# Patient Record
Sex: Male | Born: 1945 | Race: White | Hispanic: No | Marital: Married | Smoking: Never smoker
Health system: Southern US, Community
[De-identification: ages and names within clinical notes are randomized; demographics above are authoritative.]

## PROBLEM LIST (undated history)

## (undated) DIAGNOSIS — I10 Essential (primary) hypertension: Secondary | ICD-10-CM

## (undated) DIAGNOSIS — A809 Acute poliomyelitis, unspecified: Secondary | ICD-10-CM

## (undated) DIAGNOSIS — F329 Major depressive disorder, single episode, unspecified: Secondary | ICD-10-CM

## (undated) DIAGNOSIS — F32A Depression, unspecified: Secondary | ICD-10-CM

## (undated) DIAGNOSIS — F419 Anxiety disorder, unspecified: Secondary | ICD-10-CM

## (undated) HISTORY — PX: ANAL FISSURE REPAIR: SHX2312

---

## 2002-07-13 ENCOUNTER — Emergency Department (HOSPITAL_COMMUNITY): Admission: EM | Admit: 2002-07-13 | Discharge: 2002-07-13 | Payer: Self-pay | Admitting: Emergency Medicine

## 2002-07-15 ENCOUNTER — Emergency Department (HOSPITAL_COMMUNITY): Admission: EM | Admit: 2002-07-15 | Discharge: 2002-07-15 | Payer: Self-pay | Admitting: Emergency Medicine

## 2002-07-15 ENCOUNTER — Encounter: Payer: Self-pay | Admitting: Emergency Medicine

## 2013-06-09 ENCOUNTER — Emergency Department: Payer: Self-pay | Admitting: Emergency Medicine

## 2013-06-09 LAB — ETHANOL
Ethanol %: <0.003 % (ref 0.000–0.080)
Ethanol: <3 mg/dL

## 2013-06-09 LAB — URINALYSIS, COMPLETE
Bacteria: NONE SEEN
Bilirubin,UR: NEGATIVE
Glucose,UR: NEGATIVE mg/dL (ref 0–75)
LEUKOCYTE ESTERASE: NEGATIVE
Nitrite: NEGATIVE
PH: 5 (ref 4.5–8.0)
SPECIFIC GRAVITY: 1.024 (ref 1.003–1.030)
Squamous Epithelial: NONE SEEN
WBC UR: 5 /HPF (ref 0–5)

## 2013-06-09 LAB — COMPREHENSIVE METABOLIC PANEL
ALK PHOS: 77 U/L
ANION GAP: 5 — AB (ref 7–16)
Albumin: 4 g/dL (ref 3.4–5.0)
BUN: 12 mg/dL (ref 7–18)
Bilirubin,Total: 0.8 mg/dL (ref 0.2–1.0)
CHLORIDE: 106 mmol/L (ref 98–107)
Calcium, Total: 9.1 mg/dL (ref 8.5–10.1)
Co2: 29 mmol/L (ref 21–32)
Creatinine: 0.87 mg/dL (ref 0.60–1.30)
EGFR (African American): >60
Glucose: 118 mg/dL — ABNORMAL HIGH (ref 65–99)
OSMOLALITY: 280 (ref 275–301)
Potassium: 3.9 mmol/L (ref 3.5–5.1)
SGOT(AST): 39 U/L — ABNORMAL HIGH (ref 15–37)
SGPT (ALT): 72 U/L (ref 12–78)
SODIUM: 140 mmol/L (ref 136–145)
Total Protein: 7.2 g/dL (ref 6.4–8.2)

## 2013-06-09 LAB — DRUG SCREEN, URINE
Amphetamines, Ur Screen: NEGATIVE (ref ?–1000)
Barbiturates, Ur Screen: NEGATIVE (ref ?–200)
Benzodiazepine, Ur Scrn: POSITIVE (ref ?–200)
Cannabinoid 50 Ng, Ur ~~LOC~~: NEGATIVE (ref ?–50)
Cocaine Metabolite,Ur ~~LOC~~: NEGATIVE (ref ?–300)
MDMA (ECSTASY) UR SCREEN: NEGATIVE (ref ?–500)
Methadone, Ur Screen: NEGATIVE (ref ?–300)
OPIATE, UR SCREEN: NEGATIVE (ref ?–300)
PHENCYCLIDINE (PCP) UR S: NEGATIVE (ref ?–25)
Tricyclic, Ur Screen: NEGATIVE (ref ?–1000)

## 2013-06-09 LAB — CBC
HCT: 52.3 % — ABNORMAL HIGH (ref 40.0–52.0)
HGB: 17.5 g/dL (ref 13.0–18.0)
MCH: 31.5 pg (ref 26.0–34.0)
MCHC: 33.4 g/dL (ref 32.0–36.0)
MCV: 94 fL (ref 80–100)
Platelet: 270 10*3/uL (ref 150–440)
RBC: 5.55 10*6/uL (ref 4.40–5.90)
RDW: 13.8 % (ref 11.5–14.5)
WBC: 16.8 10*3/uL — AB (ref 3.8–10.6)

## 2013-06-09 LAB — ACETAMINOPHEN LEVEL: Acetaminophen: <2 ug/mL

## 2013-06-09 LAB — SALICYLATE LEVEL: Salicylates, Serum: <1.7 mg/dL

## 2013-08-04 ENCOUNTER — Emergency Department: Payer: Self-pay | Admitting: Emergency Medicine

## 2013-08-04 LAB — COMPREHENSIVE METABOLIC PANEL
ALK PHOS: 95 U/L
Albumin: 4 g/dL (ref 3.4–5.0)
Anion Gap: 5 — ABNORMAL LOW (ref 7–16)
BUN: 14 mg/dL (ref 7–18)
Bilirubin,Total: 0.5 mg/dL (ref 0.2–1.0)
CALCIUM: 9.3 mg/dL (ref 8.5–10.1)
CHLORIDE: 107 mmol/L (ref 98–107)
Co2: 30 mmol/L (ref 21–32)
Creatinine: 0.79 mg/dL (ref 0.60–1.30)
EGFR (African American): >60
Glucose: 104 mg/dL — ABNORMAL HIGH (ref 65–99)
Osmolality: 284 (ref 275–301)
Potassium: 3.9 mmol/L (ref 3.5–5.1)
SGOT(AST): 27 U/L (ref 15–37)
SGPT (ALT): 43 U/L (ref 12–78)
SODIUM: 142 mmol/L (ref 136–145)
Total Protein: 7.6 g/dL (ref 6.4–8.2)

## 2013-08-04 LAB — CBC
HCT: 46.7 % (ref 40.0–52.0)
HGB: 15.3 g/dL (ref 13.0–18.0)
MCH: 31.2 pg (ref 26.0–34.0)
MCHC: 32.7 g/dL (ref 32.0–36.0)
MCV: 95 fL (ref 80–100)
PLATELETS: 269 10*3/uL (ref 150–440)
RBC: 4.9 10*6/uL (ref 4.40–5.90)
RDW: 13.9 % (ref 11.5–14.5)
WBC: 5.5 10*3/uL (ref 3.8–10.6)

## 2013-08-04 LAB — URINALYSIS, COMPLETE
BILIRUBIN, UR: NEGATIVE
BLOOD: NEGATIVE
Bacteria: NONE SEEN
Glucose,UR: NEGATIVE mg/dL (ref 0–75)
Leukocyte Esterase: NEGATIVE
Nitrite: NEGATIVE
Ph: 6 (ref 4.5–8.0)
Protein: NEGATIVE
RBC,UR: 1 /HPF (ref 0–5)
SQUAMOUS EPITHELIAL: NONE SEEN
Specific Gravity: 1.024 (ref 1.003–1.030)

## 2013-08-04 LAB — DRUG SCREEN, URINE
Amphetamines, Ur Screen: NEGATIVE (ref ?–1000)
BENZODIAZEPINE, UR SCRN: POSITIVE (ref ?–200)
Barbiturates, Ur Screen: NEGATIVE (ref ?–200)
CANNABINOID 50 NG, UR ~~LOC~~: NEGATIVE (ref ?–50)
COCAINE METABOLITE, UR ~~LOC~~: NEGATIVE (ref ?–300)
MDMA (ECSTASY) UR SCREEN: NEGATIVE (ref ?–500)
Methadone, Ur Screen: NEGATIVE (ref ?–300)
Opiate, Ur Screen: NEGATIVE (ref ?–300)
Phencyclidine (PCP) Ur S: NEGATIVE (ref ?–25)
TRICYCLIC, UR SCREEN: NEGATIVE (ref ?–1000)

## 2013-08-04 LAB — ETHANOL
Ethanol %: <0.003 % (ref 0.000–0.080)
Ethanol: <3 mg/dL

## 2013-08-04 LAB — SALICYLATE LEVEL: Salicylates, Serum: <1.7 mg/dL

## 2013-08-04 LAB — ACETAMINOPHEN LEVEL

## 2013-08-04 LAB — TSH: THYROID STIMULATING HORM: 0.522 u[IU]/mL

## 2013-08-21 ENCOUNTER — Emergency Department (HOSPITAL_COMMUNITY)
Admission: EM | Admit: 2013-08-21 | Discharge: 2013-08-22 | Disposition: A | Discharge status: Home / Self Care | Payer: Medicare HMO | Attending: Emergency Medicine | Admitting: Emergency Medicine

## 2013-08-21 ENCOUNTER — Encounter (HOSPITAL_COMMUNITY): Payer: Self-pay | Admitting: Emergency Medicine

## 2013-08-21 DIAGNOSIS — F3289 Other specified depressive episodes: Secondary | ICD-10-CM | POA: Insufficient documentation

## 2013-08-21 DIAGNOSIS — R45851 Suicidal ideations: Secondary | ICD-10-CM | POA: Insufficient documentation

## 2013-08-21 DIAGNOSIS — I10 Essential (primary) hypertension: Secondary | ICD-10-CM | POA: Insufficient documentation

## 2013-08-21 DIAGNOSIS — F339 Major depressive disorder, recurrent, unspecified: Secondary | ICD-10-CM | POA: Diagnosis present

## 2013-08-21 DIAGNOSIS — Z8612 Personal history of poliomyelitis: Secondary | ICD-10-CM | POA: Insufficient documentation

## 2013-08-21 DIAGNOSIS — F329 Major depressive disorder, single episode, unspecified: Secondary | ICD-10-CM | POA: Insufficient documentation

## 2013-08-21 DIAGNOSIS — H5316 Psychophysical visual disturbances: Secondary | ICD-10-CM | POA: Insufficient documentation

## 2013-08-21 HISTORY — DX: Depression, unspecified: F32.A

## 2013-08-21 HISTORY — DX: Anxiety disorder, unspecified: F41.9

## 2013-08-21 HISTORY — DX: Acute poliomyelitis, unspecified: A80.9

## 2013-08-21 HISTORY — DX: Essential (primary) hypertension: I10

## 2013-08-21 HISTORY — DX: Major depressive disorder, single episode, unspecified: F32.9

## 2013-08-21 LAB — COMPREHENSIVE METABOLIC PANEL
ALT: 32 U/L (ref 0–53)
AST: 24 U/L (ref 0–37)
Albumin: 3.9 g/dL (ref 3.5–5.2)
Alkaline Phosphatase: 82 U/L (ref 39–117)
BILIRUBIN TOTAL: 0.4 mg/dL (ref 0.3–1.2)
BUN: 18 mg/dL (ref 6–23)
CHLORIDE: 104 meq/L (ref 96–112)
CO2: 26 mEq/L (ref 19–32)
Calcium: 9.4 mg/dL (ref 8.4–10.5)
Creatinine, Ser: 0.81 mg/dL (ref 0.50–1.35)
GFR calc Af Amer: >90 mL/min (ref >90)
GFR calc non Af Amer: 89 mL/min — ABNORMAL LOW (ref >90)
Glucose, Bld: 101 mg/dL — ABNORMAL HIGH (ref 70–99)
Potassium: 4.3 mEq/L (ref 3.7–5.3)
Sodium: 144 mEq/L (ref 137–147)
Total Protein: 6.8 g/dL (ref 6.0–8.3)

## 2013-08-21 LAB — RAPID URINE DRUG SCREEN, HOSP PERFORMED
Amphetamines: NOT DETECTED
BENZODIAZEPINES: POSITIVE — AB
Barbiturates: NOT DETECTED
Cocaine: NOT DETECTED
OPIATES: NOT DETECTED
Tetrahydrocannabinol: NOT DETECTED

## 2013-08-21 LAB — CBC
HEMATOCRIT: 47 % (ref 39.0–52.0)
Hemoglobin: 15.6 g/dL (ref 13.0–17.0)
MCH: 31.1 pg (ref 26.0–34.0)
MCHC: 33.2 g/dL (ref 30.0–36.0)
MCV: 93.6 fL (ref 78.0–100.0)
Platelets: 285 10*3/uL (ref 150–400)
RBC: 5.02 MIL/uL (ref 4.22–5.81)
RDW: 13.9 % (ref 11.5–15.5)
WBC: 7.2 10*3/uL (ref 4.0–10.5)

## 2013-08-21 LAB — ACETAMINOPHEN LEVEL: Acetaminophen (Tylenol), Serum: <15 ug/mL (ref 10–30)

## 2013-08-21 LAB — SALICYLATE LEVEL

## 2013-08-21 LAB — ETHANOL

## 2013-08-21 MED ORDER — ZOLPIDEM TARTRATE 10 MG PO TABS
10.0000 mg | ORAL_TABLET | Freq: Every day | ORAL | Status: DC
  Administered 2013-08-22: 10 mg via ORAL
  Filled 2013-08-21: qty 1

## 2013-08-21 MED ORDER — ACETAMINOPHEN 325 MG PO TABS: 650.0000 mg | ORAL_TABLET | ORAL | Status: DC | PRN

## 2013-08-21 MED ORDER — ALPRAZOLAM 0.5 MG PO TABS
0.5000 mg | ORAL_TABLET | Freq: Two times a day (BID) | ORAL | Status: DC
  Administered 2013-08-21 – 2013-08-22 (×2): 0.5 mg via ORAL
  Filled 2013-08-21 (×2): qty 1

## 2013-08-21 MED ORDER — VENLAFAXINE HCL 75 MG PO TABS
75.0000 mg | ORAL_TABLET | Freq: Every evening | ORAL | Status: DC
  Administered 2013-08-22: 75 mg via ORAL
  Filled 2013-08-21: qty 1

## 2013-08-21 MED ORDER — LISINOPRIL 20 MG PO TABS
20.0000 mg | ORAL_TABLET | Freq: Every morning | ORAL | Status: DC
  Administered 2013-08-22: 20 mg via ORAL
  Filled 2013-08-21: qty 1

## 2013-08-21 MED ORDER — OLANZAPINE 5 MG PO TABS
5.0000 mg | ORAL_TABLET | Freq: Two times a day (BID) | ORAL | Status: DC
  Administered 2013-08-21 – 2013-08-22 (×2): 5 mg via ORAL
  Filled 2013-08-21 (×2): qty 1

## 2013-08-21 MED ORDER — ZOLPIDEM TARTRATE 5 MG PO TABS: 5.0000 mg | ORAL_TABLET | Freq: Every evening | ORAL | Status: DC | PRN

## 2013-08-21 MED ORDER — IBUPROFEN 200 MG PO TABS: 600.0000 mg | ORAL_TABLET | Freq: Three times a day (TID) | ORAL | Status: DC | PRN

## 2013-08-21 MED ORDER — LORAZEPAM 1 MG PO TABS: 1.0000 mg | ORAL_TABLET | Freq: Three times a day (TID) | ORAL | Status: DC | PRN

## 2013-08-21 MED ORDER — MEMANTINE HCL 5 MG PO TABS
5.0000 mg | ORAL_TABLET | Freq: Every day | ORAL | Status: DC
  Administered 2013-08-22: 5 mg via ORAL
  Filled 2013-08-21: qty 1

## 2013-08-21 MED ORDER — ONDANSETRON HCL 4 MG PO TABS: 4.0000 mg | ORAL_TABLET | Freq: Three times a day (TID) | ORAL | Status: DC | PRN

## 2013-08-21 MED ORDER — ALUM & MAG HYDROXIDE-SIMETH 200-200-20 MG/5ML PO SUSP: 30.0000 mL | ORAL | Status: DC | PRN

## 2013-08-21 MED ORDER — NICOTINE 21 MG/24HR TD PT24: 21.0000 mg | MEDICATED_PATCH | Freq: Every day | TRANSDERMAL | Status: DC

## 2013-08-21 MED ORDER — VENLAFAXINE HCL ER 150 MG PO CP24
150.0000 mg | ORAL_CAPSULE | Freq: Every day | ORAL | Status: DC
  Administered 2013-08-21 – 2013-08-22 (×2): 150 mg via ORAL
  Filled 2013-08-21 (×2): qty 1

## 2013-08-21 MED ORDER — GABAPENTIN 100 MG PO CAPS
200.0000 mg | ORAL_CAPSULE | Freq: Two times a day (BID) | ORAL | Status: DC
  Administered 2013-08-21 – 2013-08-22 (×2): 200 mg via ORAL
  Filled 2013-08-21 (×3): qty 2

## 2013-08-21 NOTE — ED Notes (Signed)
TTS consult completed 
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
## 2013-08-21 NOTE — ED Provider Notes (Signed)
Alex MutterPeter S Jaylenne Allison 8:00 PM patient discussed in sign out. Patient presenting with suicidal ideations. Laboratory tests are pending for further medical clearance. Initial examination without any concerns.   9:00 p.m. laboratory testing within normal limits. Patient is medically cleared and stable for further psychiatric evaluation.  Angus Sellereter S Frazier Balfour, PA-C 08/21/13 2135

## 2013-08-21 NOTE — ED Notes (Signed)
Unit secretary [facility]--several patient ahead of patient, will be awhile before TTS completed Patient and pt's family made aware

## 2013-08-21 NOTE — ED Notes (Signed)
Pt escorted back to closed TCU to change into blue scrubs. Pt must keep his leg braces one, and tennis shoes as he is unable to walk without these. All other belonging placed into pt belonging bags, and put behind nurses station by [employer].
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
## 2013-08-21 NOTE — ED Notes (Signed)
[facility] on arrival of staff for TTS consult, per family request--no answer  Candise BowensJen, RN ED Charge Nurse aware and family made aware that attempts are still being made to find out an ETA for TTS

## 2013-08-21 NOTE — ED Notes (Signed)
PM medications given, see MAR Patient moved to TCU for TTS consult to take place

## 2013-08-21 NOTE — ED Notes (Signed)
Pt aware urine sample is needed 

## 2013-08-21 NOTE — ED Provider Notes (Signed)
Medical screening examination/treatment/procedure(s) were performed by non-physician practitioner and as supervising physician I was immediately available for consultation/collaboration.    Linwood DibblesJon Solstice Lastinger, MD 08/21/13 2017

## 2013-08-22 ENCOUNTER — Encounter (HOSPITAL_COMMUNITY): Payer: Self-pay | Admitting: Psychiatry

## 2013-08-22 ENCOUNTER — Emergency Department (HOSPITAL_COMMUNITY): Payer: Medicare HMO

## 2013-08-22 DIAGNOSIS — F332 Major depressive disorder, recurrent severe without psychotic features: Secondary | ICD-10-CM

## 2013-08-22 DIAGNOSIS — F339 Major depressive disorder, recurrent, unspecified: Secondary | ICD-10-CM | POA: Diagnosis present

## 2013-08-22 DIAGNOSIS — R45851 Suicidal ideations: Secondary | ICD-10-CM

## 2013-08-22 MED ORDER — MEMANTINE HCL 5 MG PO TABS: 5.0000 mg | ORAL_TABLET | Freq: Every day | ORAL | Status: DC

## 2013-08-22 MED ORDER — ZOLPIDEM TARTRATE 5 MG PO TABS: 5.0000 mg | ORAL_TABLET | Freq: Every day | ORAL | Status: DC

## 2013-08-22 MED ORDER — MEMANTINE HCL 5 MG PO TABS: 5.0000 mg | ORAL_TABLET | Freq: Two times a day (BID) | ORAL | Status: DC

## 2013-08-22 MED ORDER — MEMANTINE HCL 10 MG PO TABS: 10.0000 mg | ORAL_TABLET | Freq: Two times a day (BID) | ORAL | Status: DC

## 2013-08-22 MED ORDER — MEMANTINE HCL 10 MG PO TABS: 10.0000 mg | ORAL_TABLET | Freq: Every day | ORAL | Status: DC

## 2013-08-22 NOTE — Progress Notes (Signed)
CSW received a call from [place] with [facility] accepting the patient per Dr. Seth Bake. Report#[phone]. [facility] address is [address] [place] [place], [place] [address]. CSW informed the nursing staff of the updated disposition and the patient can arrive for admission as soon as possible.       Maryelizabeth Rowan, MSW, [place], 08/22/2013 Evening Clinical Social Worker [phone]

## 2013-08-22 NOTE — ED Notes (Signed)
Bed: WA28 Expected date:  Expected time:  Means of arrival:  Comments: TCU 

## 2013-08-22 NOTE — Discharge Instructions (Signed)
Go directly to [facility]

## 2013-08-22 NOTE — ED Notes (Signed)
Patient resting in position of comfort with eyes closed s/p admin of Ambien, see MAR RR WNL--even and unlabored with equal rise and fall of chest Patient in NAD Side rails up, call bell in reach

## 2013-08-22 NOTE — ED Notes (Signed)
Call from [facility] inquiring about patients mobility and need for assistance with ADL's.

## 2013-08-22 NOTE — ED Notes (Signed)
MD at bedside, Dr. Kumar.  

## 2013-08-22 NOTE — BH Assessment (Signed)
Pt is currently voluntary and spouse has POA, not HCPOA.  Pt.'s spouse states that he doesn't want to sign in voluntarily for inpt admission and may need to be committed.  This Clinical research associate spoke with pt and advised that psychiatric pa, Alberteen Sam, NP has recommended inpt admissions.  This Clinical research associate contacted the following facilities: [facility](expecting d/c's 05/22), [facility](expecting d/c's 05/22), [facility](bed avail 05/22) and [facility](bed avail).  All facilities state they may be able to accommodate pt ambulatory needs after discussing case with the on-call physician.  This Clinical research associate will fax info to facilities for review.

## 2013-08-22 NOTE — ED Provider Notes (Signed)
Patient excepted at [facility]. Has been cleared to go by private vehicle to the ED by the accepting physician.  Toy Baker, MD 08/22/13 2045

## 2013-08-22 NOTE — ED Notes (Signed)
Patient transported to X-ray 

## 2013-08-22 NOTE — ED Notes (Signed)
Patient's wife, Fraser Vlahos [phone]

## 2013-08-22 NOTE — ED Notes (Signed)
Patients wife was given all patients belongings, No belongings left in  [place].

## 2013-08-22 NOTE — Progress Notes (Signed)
  CARE MANAGEMENT ED NOTE 08/22/2013  Patient:  Alex Allison   Account Number:  [id number]  Date Initiated:  08/22/2013  Documentation initiated by:  Radford Pax  Subjective/Objective Assessment:   Patient presents to Ed with SI     Subjective/Objective Assessment Detail:   Patient with pmhx of depression and anxiety.     Action/Plan:   Action/Plan Detail:   Anticipated DC Date:       Status Recommendation to Physician:   Result of Recommendation:    Other ED Services  Consult Working Plan    DC Planning Services  Other  PCP issues    Choice offered to / List presented to:            Status of service:  Completed, signed off  ED Comments:   ED Comments Detail:  EDCM spoke to patient and family member at bedside.  As per patient's family member, patient's pcp is Dr. Juel Burrow at [facility].  System updated.

## 2013-08-22 NOTE — ED Notes (Signed)
1:1 sitter present at bedside Patient awake, watching TV and denies complaints or needs at this time Call bell in reach

## 2013-08-22 NOTE — ED Notes (Signed)
Pt accepted at [facility].  Nursing contacted [facility] and was told that pt was ok to be transported by Wife to the facility.  Request for Voluntary Admission faxed to [facility] and pt discharged.  Nursing did consult with EDP Dr. Freida Busman who stated that transport method was acceptable.
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
## 2013-08-22 NOTE — Consult Note (Signed)
Patient seen, evaluated, treatment plan formulated by me. Patient's wife reports that patient is severely depressed, has been seeing his outpatient psychiatrist 100 your basis since his discharge from [facility]. My states that the patient is overwhelmed, is having thoughts of killing himself. She reports that the patient was to be involuntarily committed by his psychiatrist due to safety concerns. Patient severely depressed, suicidal and needs inpatient treatment
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
## 2014-08-16 ENCOUNTER — Encounter: Payer: Self-pay | Admitting: Emergency Medicine

## 2014-08-16 ENCOUNTER — Emergency Department: Payer: Medicare HMO

## 2014-08-16 ENCOUNTER — Inpatient Hospital Stay
Admission: EM | Admit: 2014-08-16 | Discharge: 2014-09-02 | DRG: 408 | Disposition: E | Payer: Medicare HMO | Attending: Internal Medicine | Admitting: Internal Medicine

## 2014-08-16 DIAGNOSIS — R0902 Hypoxemia: Secondary | ICD-10-CM | POA: Diagnosis not present

## 2014-08-16 DIAGNOSIS — Z66 Do not resuscitate: Secondary | ICD-10-CM | POA: Diagnosis present

## 2014-08-16 DIAGNOSIS — J95821 Acute postprocedural respiratory failure: Secondary | ICD-10-CM | POA: Diagnosis not present

## 2014-08-16 DIAGNOSIS — J690 Pneumonitis due to inhalation of food and vomit: Secondary | ICD-10-CM | POA: Diagnosis not present

## 2014-08-16 DIAGNOSIS — K810 Acute cholecystitis: Secondary | ICD-10-CM

## 2014-08-16 DIAGNOSIS — A419 Sepsis, unspecified organism: Secondary | ICD-10-CM | POA: Diagnosis not present

## 2014-08-16 DIAGNOSIS — G894 Chronic pain syndrome: Secondary | ICD-10-CM | POA: Diagnosis present

## 2014-08-16 DIAGNOSIS — K819 Cholecystitis, unspecified: Secondary | ICD-10-CM

## 2014-08-16 DIAGNOSIS — R0602 Shortness of breath: Secondary | ICD-10-CM

## 2014-08-16 DIAGNOSIS — M4856XA Collapsed vertebra, not elsewhere classified, lumbar region, initial encounter for fracture: Secondary | ICD-10-CM | POA: Diagnosis present

## 2014-08-16 DIAGNOSIS — R092 Respiratory arrest: Secondary | ICD-10-CM

## 2014-08-16 DIAGNOSIS — F329 Major depressive disorder, single episode, unspecified: Secondary | ICD-10-CM | POA: Diagnosis present

## 2014-08-16 DIAGNOSIS — G14 Postpolio syndrome: Secondary | ICD-10-CM | POA: Diagnosis present

## 2014-08-16 DIAGNOSIS — G9340 Encephalopathy, unspecified: Secondary | ICD-10-CM | POA: Diagnosis not present

## 2014-08-16 DIAGNOSIS — G8929 Other chronic pain: Secondary | ICD-10-CM | POA: Diagnosis not present

## 2014-08-16 DIAGNOSIS — M4854XA Collapsed vertebra, not elsewhere classified, thoracic region, initial encounter for fracture: Secondary | ICD-10-CM | POA: Diagnosis present

## 2014-08-16 DIAGNOSIS — J9589 Other postprocedural complications and disorders of respiratory system, not elsewhere classified: Secondary | ICD-10-CM | POA: Diagnosis not present

## 2014-08-16 DIAGNOSIS — Z515 Encounter for palliative care: Secondary | ICD-10-CM

## 2014-08-16 DIAGNOSIS — J383 Other diseases of vocal cords: Secondary | ICD-10-CM | POA: Diagnosis not present

## 2014-08-16 DIAGNOSIS — R7989 Other specified abnormal findings of blood chemistry: Secondary | ICD-10-CM | POA: Diagnosis present

## 2014-08-16 DIAGNOSIS — R6520 Severe sepsis without septic shock: Secondary | ICD-10-CM | POA: Diagnosis not present

## 2014-08-16 DIAGNOSIS — Z978 Presence of other specified devices: Secondary | ICD-10-CM

## 2014-08-16 DIAGNOSIS — N200 Calculus of kidney: Secondary | ICD-10-CM | POA: Diagnosis present

## 2014-08-16 DIAGNOSIS — R531 Weakness: Secondary | ICD-10-CM | POA: Diagnosis not present

## 2014-08-16 DIAGNOSIS — I10 Essential (primary) hypertension: Secondary | ICD-10-CM | POA: Diagnosis present

## 2014-08-16 DIAGNOSIS — R0689 Other abnormalities of breathing: Secondary | ICD-10-CM | POA: Diagnosis not present

## 2014-08-16 DIAGNOSIS — K8066 Calculus of gallbladder and bile duct with acute and chronic cholecystitis without obstruction: Secondary | ICD-10-CM | POA: Diagnosis present

## 2014-08-16 DIAGNOSIS — K8000 Calculus of gallbladder with acute cholecystitis without obstruction: Secondary | ICD-10-CM | POA: Diagnosis present

## 2014-08-16 DIAGNOSIS — M6289 Other specified disorders of muscle: Secondary | ICD-10-CM | POA: Diagnosis present

## 2014-08-16 DIAGNOSIS — J9600 Acute respiratory failure, unspecified whether with hypoxia or hypercapnia: Secondary | ICD-10-CM | POA: Diagnosis not present

## 2014-08-16 DIAGNOSIS — J9811 Atelectasis: Secondary | ICD-10-CM | POA: Diagnosis not present

## 2014-08-16 DIAGNOSIS — K8050 Calculus of bile duct without cholangitis or cholecystitis without obstruction: Secondary | ICD-10-CM

## 2014-08-16 DIAGNOSIS — Z4659 Encounter for fitting and adjustment of other gastrointestinal appliance and device: Secondary | ICD-10-CM

## 2014-08-16 DIAGNOSIS — J9690 Respiratory failure, unspecified, unspecified whether with hypoxia or hypercapnia: Secondary | ICD-10-CM

## 2014-08-16 LAB — CBC WITH DIFFERENTIAL/PLATELET
BASOS PCT: 1 %
Basophils Absolute: 0.1 10*3/uL (ref 0–0.1)
EOS ABS: 0 10*3/uL (ref 0–0.7)
EOS PCT: 0 %
HEMATOCRIT: 48.1 % (ref 40.0–52.0)
HEMOGLOBIN: 15.8 g/dL (ref 13.0–18.0)
Lymphocytes Relative: 8 %
Lymphs Abs: 1 10*3/uL (ref 1.0–3.6)
MCH: 30.7 pg (ref 26.0–34.0)
MCHC: 32.9 g/dL (ref 32.0–36.0)
MCV: 93.3 fL (ref 80.0–100.0)
MONO ABS: 1.2 10*3/uL — AB (ref 0.2–1.0)
Monocytes Relative: 9 %
Neutro Abs: 10.6 10*3/uL — ABNORMAL HIGH (ref 1.4–6.5)
Neutrophils Relative %: 82 %
Platelets: 252 10*3/uL (ref 150–440)
RBC: 5.16 MIL/uL (ref 4.40–5.90)
RDW: 14 % (ref 11.5–14.5)
WBC: 12.9 10*3/uL — ABNORMAL HIGH (ref 3.8–10.6)

## 2014-08-16 LAB — COMPREHENSIVE METABOLIC PANEL
ALBUMIN: 4.5 g/dL (ref 3.5–5.0)
ALT: 27 U/L (ref 17–63)
AST: 28 U/L (ref 15–41)
Alkaline Phosphatase: 64 U/L (ref 38–126)
Anion gap: 9 (ref 5–15)
BUN: 13 mg/dL (ref 6–20)
CHLORIDE: 104 mmol/L (ref 101–111)
CO2: 27 mmol/L (ref 22–32)
Calcium: 9.1 mg/dL (ref 8.9–10.3)
Creatinine, Ser: 0.87 mg/dL (ref 0.61–1.24)
GLUCOSE: 135 mg/dL — AB (ref 65–99)
POTASSIUM: 4.2 mmol/L (ref 3.5–5.1)
Sodium: 140 mmol/L (ref 135–145)
TOTAL PROTEIN: 7.5 g/dL (ref 6.5–8.1)
Total Bilirubin: 0.7 mg/dL (ref 0.3–1.2)

## 2014-08-16 LAB — URINALYSIS COMPLETE WITH MICROSCOPIC (ARMC ONLY)
Bacteria, UA: NONE SEEN
Bilirubin Urine: NEGATIVE
Glucose, UA: NEGATIVE mg/dL
Ketones, ur: NEGATIVE mg/dL
Leukocytes, UA: NEGATIVE
NITRITE: NEGATIVE
Protein, ur: NEGATIVE mg/dL
Specific Gravity, Urine: 1.021 (ref 1.005–1.030)
pH: 7 (ref 5.0–8.0)

## 2014-08-16 LAB — GLUCOSE, CAPILLARY: Glucose-Capillary: 117 mg/dL — ABNORMAL HIGH (ref 65–99)

## 2014-08-16 MED ORDER — GABAPENTIN 100 MG PO CAPS
200.0000 mg | ORAL_CAPSULE | Freq: Two times a day (BID) | ORAL | Status: DC
  Administered 2014-08-17 – 2014-08-28 (×18): 200 mg via ORAL
  Filled 2014-08-16 (×19): qty 2

## 2014-08-16 MED ORDER — MIRTAZAPINE 15 MG PO TABS
22.5000 mg | ORAL_TABLET | Freq: Every day | ORAL | Status: DC
  Administered 2014-08-17 – 2014-08-27 (×10): 22.5 mg via ORAL
  Filled 2014-08-16 (×11): qty 2

## 2014-08-16 MED ORDER — PANTOPRAZOLE SODIUM 40 MG IV SOLR
40.0000 mg | Freq: Two times a day (BID) | INTRAVENOUS | Status: DC
  Administered 2014-08-16 – 2014-08-20 (×9): 40 mg via INTRAVENOUS
  Filled 2014-08-16 (×9): qty 40

## 2014-08-16 MED ORDER — TRAZODONE HCL 50 MG PO TABS
50.0000 mg | ORAL_TABLET | Freq: Every day | ORAL | Status: DC
  Administered 2014-08-17 – 2014-08-27 (×10): 50 mg via ORAL
  Filled 2014-08-16 (×11): qty 1

## 2014-08-16 MED ORDER — SODIUM CHLORIDE 0.9 % IV SOLN
3.0000 g | Freq: Four times a day (QID) | INTRAVENOUS | Status: DC
  Administered 2014-08-16 – 2014-08-22 (×23): 3 g via INTRAVENOUS
  Administered 2014-08-22: 10:00:00 via INTRAVENOUS
  Administered 2014-08-22 – 2014-08-25 (×11): 3 g via INTRAVENOUS
  Administered 2014-08-25: 04:00:00 via INTRAVENOUS
  Administered 2014-08-25 – 2014-08-28 (×10): 3 g via INTRAVENOUS
  Filled 2014-08-16 (×57): qty 3

## 2014-08-16 MED ORDER — KETOROLAC TROMETHAMINE 30 MG/ML IJ SOLN
INTRAMUSCULAR | Status: AC
  Administered 2014-08-16: 30 mg via INTRAVENOUS
  Filled 2014-08-16: qty 1

## 2014-08-16 MED ORDER — KETOROLAC TROMETHAMINE 30 MG/ML IJ SOLN
30.0000 mg | Freq: Once | INTRAMUSCULAR | Status: AC
  Administered 2014-08-16: 30 mg via INTRAVENOUS

## 2014-08-16 MED ORDER — ONDANSETRON HCL 4 MG PO TABS: 4.0000 mg | ORAL_TABLET | Freq: Four times a day (QID) | ORAL | Status: DC | PRN

## 2014-08-16 MED ORDER — OLANZAPINE 2.5 MG PO TABS
2.5000 mg | ORAL_TABLET | Freq: Every day | ORAL | Status: DC
  Administered 2014-08-17 – 2014-08-27 (×10): 2.5 mg via ORAL
  Filled 2014-08-16 (×15): qty 1

## 2014-08-16 MED ORDER — MORPHINE SULFATE 2 MG/ML IJ SOLN
2.0000 mg | INTRAMUSCULAR | Status: DC | PRN
  Administered 2014-08-16 – 2014-08-26 (×12): 2 mg via INTRAVENOUS
  Filled 2014-08-16 (×10): qty 1

## 2014-08-16 MED ORDER — ONDANSETRON HCL 4 MG/2ML IJ SOLN: 4.0000 mg | Freq: Four times a day (QID) | INTRAMUSCULAR | Status: DC | PRN

## 2014-08-16 MED ORDER — HEPARIN SODIUM (PORCINE) 5000 UNIT/ML IJ SOLN
5000.0000 [IU] | Freq: Three times a day (TID) | INTRAMUSCULAR | Status: DC
  Administered 2014-08-16 – 2014-08-18 (×8): 5000 [IU] via SUBCUTANEOUS
  Filled 2014-08-16 (×8): qty 1

## 2014-08-16 MED ORDER — DEXTROSE IN LACTATED RINGERS 5 % IV SOLN
INTRAVENOUS | Status: DC
  Administered 2014-08-16 – 2014-08-20 (×8): via INTRAVENOUS
  Filled 2014-08-16 (×13): qty 1000

## 2014-08-16 NOTE — ED Notes (Signed)
Pt c/o RLQ abd. Pain x 2 days, denies any urinary symptoms, having some nausea but denies any vomiting, pt states he has not had a BM since Friday

## 2014-08-16 NOTE — ED Provider Notes (Signed)
[facility] Emergency Department Provider Note  ____________________________________________  Time seen: 12:20 PM  I have reviewed the triage vital signs and the nursing notes.   HISTORY  Chief Complaint Abdominal Pain      HPI Dyke Bracketthomas W Legler is a 69 y.o. male who presents with right flank pain which started 2 days ago. The pain is sharp and severe. He has never had this before. He complains of nausea but no vomiting. He has had some diaphoresis as well. He reports his urine looks normal. No fevers chills, normal bowel movements.     Past Medical History  Diagnosis Date  . Depressed   . Anxiety   . Polio   . Hypertension     Patient Active Problem List   Diagnosis Date Noted  . Major depression, recurrent 08/22/2013  . Suicidal ideations 08/22/2013    Past Surgical History  Procedure Laterality Date  . Anal fissure repair      Current Outpatient Rx  Name  Route  Sig  Dispense  Refill  . ALPRAZolam (XANAX) 0.5 MG tablet   Oral   Take 0.5 mg by mouth 2 (two) times daily.         Marland Kitchen. gabapentin (NEURONTIN) 100 MG capsule   Oral   Take 200 mg by mouth 2 (two) times daily.         Marland Kitchen. lisinopril (PRINIVIL,ZESTRIL) 20 MG tablet   Oral   Take 20 mg by mouth every morning.         . memantine (NAMENDA TITRATION PACK) tablet pack   Oral   Take 5-20 mg by mouth See admin instructions. 5 mg/day for =1 week; 5 mg twice daily for =1 week; 15 mg/day given in 5 mg and 10 mg separated doses for =1 week; then 10 mg twice daily         . OLANZapine (ZYPREXA) 5 MG tablet   Oral   Take 5 mg by mouth 2 (two) times daily.         Marland Kitchen. venlafaxine (EFFEXOR) 75 MG tablet   Oral   Take 75 mg by mouth every evening.         . venlafaxine XR (EFFEXOR-XR) 150 MG 24 hr capsule   Oral   Take 150 mg by mouth daily with breakfast.         . zolpidem (AMBIEN) 10 MG tablet   Oral   Take 10 mg by mouth at bedtime.            Allergies Review of patient's allergies indicates no known allergies.  History reviewed. No pertinent family history.  Social History History  Substance Use Topics  . Smoking status: Never Smoker   . Smokeless tobacco: Not on file  . Alcohol Use: No    Review of Systems  Constitutional: Negative for fever. Eyes: Negative for visual changes. ENT: Negative for sore throat. Cardiovascular: Negative for chest pain. Respiratory: Negative for shortness of breath. Gastrointestinal: Positive for right flank pain, positive for nausea Genitourinary: Negative for dysuria. Musculoskeletal: Negative for back pain. Skin: Negative for rash. Neurological: Negative for headaches, focal weakness or numbness. Psychiatric: No anxiety  10-point ROS otherwise negative.  ____________________________________________   PHYSICAL EXAM:  VITAL SIGNS: ED Triage Vitals  Enc Vitals Group     BP 08/31/2014 1138 156/82 mmHg     Pulse Rate 08/07/2014 1138 92     Resp 08/21/2014 1138 18     Temp 08/30/2014 1138 98.8 F (37.1  C)     Temp Source 08/23/2014 1138 Oral     SpO2 08/25/2014 1138 96 %     Weight 08/30/2014 1138 185 lb (83.915 kg)     Height 08/31/2014 1138 5\' 11"  (1.803 m)     Head Cir --      Peak Flow --      Pain Score 08/21/2014 1139 10     Pain Loc --      Pain Edu? --      Excl. in GC? --      Constitutional: Alert and oriented. Appears uncomfortable Eyes: Conjunctivae are normal. PERRL. Normal extraocular movements. ENT   Head: Normocephalic and atraumatic.   Nose: No congestion/rhinnorhea.   Mouth/Throat: Mucous membranes are moist.   Neck: No stridor. Hematological/Lymphatic/Immunilogical: No cervical lymphadenopathy. Cardiovascular: Normal rate, regular rhythm. Normal and symmetric distal pulses are present in all extremities. No murmurs, rubs, or gallops. Respiratory: Normal respiratory effort without tachypnea nor retractions. Breath sounds are clear and equal  bilaterally. No wheezes/rales/rhonchi. Gastrointestinal: Soft and nontender. No distention. There is no CVA tenderness. There is no right lower quadrant tenderness Genitourinary: deferred Musculoskeletal: Nontender with normal range of motion in all extremities. No joint effusions.  No lower extremity tenderness nor edema. Neurologic:  Normal speech and language. No gross focal neurologic deficits are appreciated. Speech is normal.  Skin:  Skin is warm, dry and intact. No rash noted. Psychiatric: Mood and affect are normal. Speech and behavior are normal. Patient exhibits appropriate insight and judgment.  ____________________________________________    LABS (pertinent positives/negatives)  Cbc abnormal with elevated white blood cell count  Labs otherwise unremarkable  ____________________________________________   EKG  ED ECG REPORT   Date: 08/03/2014  EKG Time: 1157  Rate: 93  Rhythm: normal sinus rhythm, PAC's noted  Axis: Normal axis  Intervals:none  ST&T Change: Nonspecific   ____________________________________________    RADIOLOGY  CT consistent with acute cholecystitis  ____________________________________________   PROCEDURES  Procedure(s) performed: None  Critical Care performed: None   ____________________________________________   INITIAL IMPRESSION / ASSESSMENT AND PLAN / ED COURSE  Pertinent labs & imaging results that were available during my care of the patient were reviewed by me and considered in my medical decision making (see chart for details).  Strongly suspect ureterolithiasis. We will give Toradol 30 mg IV,normal saline IV. We will obtain a CAT scan  ----------------------------------------- 2:19 PM on 08/17/2014 -----------------------------------------  CT shows no kidney stone however is consistent with acute cholecystitis. Consult to Dr. Excell Seltzerooper of surgery who will but the  patient. ____________________________________________   FINAL CLINICAL IMPRESSION(S) / ED DIAGNOSES  Final diagnoses:  Acute cholecystitis     Jene Everyobert Jalik Gellatly, MD 08/11/2014 1421

## 2014-08-16 NOTE — H&P (Signed)
Alex Allison is an 69 y.o. male.   Chief Complaint: Right flank pain HPI: This a patient with no abdominal pain but presents with right flank pain . It started Friday. He's had nausea but no vomiting no melena or hematochezia and no fevers or chills again the Zimmer had an episode like this before. He denies any abdominal pain and points to his right flank.  Past Medical History  Diagnosis Date  . Depressed   . Anxiety   . Polio   . Hypertension     Past Surgical History  Procedure Laterality Date  . Anal fissure repair      History reviewed. No pertinent family history. Social History:  reports that he has never smoked. He does not have any smokeless tobacco history on file. He reports that he does not drink alcohol or use illicit drugs.  Allergies: No Known Allergies   (Not in a hospital admission)   Review of Systems:   Review of Systems  Constitutional: Negative for fever and chills.  HENT: Negative.   Eyes: Negative.   Gastrointestinal: Positive for nausea. Negative for heartburn, vomiting, abdominal pain, diarrhea, constipation, blood in stool and melena.  Genitourinary: Negative.   Musculoskeletal: Negative.   Skin: Negative.   Neurological: Negative for dizziness, weakness and headaches.  Endo/Heme/Allergies: Negative.   Psychiatric/Behavioral: Positive for depression.    Physical Exam:  Physical Exam  Constitutional: He is oriented to person, place, and time and well-developed, well-nourished, and in no distress.  HENT:  Head: Normocephalic and atraumatic.  Eyes: No scleral icterus.  Neck: Normal range of motion. Neck supple.  Cardiovascular: Normal rate, regular rhythm and normal heart sounds.   Pulmonary/Chest: Effort normal and breath sounds normal. No respiratory distress. He has no wheezes. He exhibits no tenderness.  Abdominal: Soft. Bowel sounds are normal. He exhibits no distension and no mass. There is no tenderness. There is no rebound and no  guarding.  Abdominal exam is completely benign and nontender. Negative Murphy sign. Mild CVA tenderness on the right.  Musculoskeletal: Normal range of motion.  Neurological: He is alert and oriented to person, place, and time.  Skin: Skin is warm and dry.  Psychiatric: Affect and judgment normal.    Blood pressure 156/82, pulse 92, temperature 98.8 F (37.1 C), temperature source Oral, resp. rate 18, height 5' 11"  (1.803 m), weight 83.915 kg (185 lb), SpO2 96 %.    Results for orders placed or performed during the hospital encounter of 08/27/2014 (from the past 48 hour(s))  CBC WITH DIFFERENTIAL     Status: Abnormal   Collection Time: 08/29/2014 11:43 AM  Result Value Ref Range   WBC 12.9 (H) 3.8 - 10.6 K/uL   RBC 5.16 4.40 - 5.90 MIL/uL   Hemoglobin 15.8 13.0 - 18.0 g/dL   HCT 48.1 40.0 - 52.0 %   MCV 93.3 80.0 - 100.0 fL   MCH 30.7 26.0 - 34.0 pg   MCHC 32.9 32.0 - 36.0 g/dL   RDW 14.0 11.5 - 14.5 %   Platelets 252 150 - 440 K/uL   Neutrophils Relative % 82 %   Neutro Abs 10.6 (H) 1.4 - 6.5 K/uL   Lymphocytes Relative 8 %   Lymphs Abs 1.0 1.0 - 3.6 K/uL   Monocytes Relative 9 %   Monocytes Absolute 1.2 (H) 0.2 - 1.0 K/uL   Eosinophils Relative 0 %   Eosinophils Absolute 0.0 0 - 0.7 K/uL   Basophils Relative 1 %   Basophils  Absolute 0.1 0 - 0.1 K/uL  Comprehensive metabolic panel     Status: Abnormal   Collection Time: 08/29/2014 11:43 AM  Result Value Ref Range   Sodium 140 135 - 145 mmol/L   Potassium 4.2 3.5 - 5.1 mmol/L   Chloride 104 101 - 111 mmol/L   CO2 27 22 - 32 mmol/L   Glucose, Bld 135 (H) 65 - 99 mg/dL   BUN 13 6 - 20 mg/dL   Creatinine, Ser 0.87 0.61 - 1.24 mg/dL   Calcium 9.1 8.9 - 10.3 mg/dL   Total Protein 7.5 6.5 - 8.1 g/dL   Albumin 4.5 3.5 - 5.0 g/dL   AST 28 15 - 41 U/L   ALT 27 17 - 63 U/L   Alkaline Phosphatase 64 38 - 126 U/L   Total Bilirubin 0.7 0.3 - 1.2 mg/dL   GFR calc non Af Amer >60 >60 mL/min   GFR calc Af Amer >60 >60 mL/min     Comment: (NOTE) The eGFR has been calculated using the CKD EPI equation. This calculation has not been validated in all clinical situations. eGFR's persistently <60 mL/min signify possible Chronic Kidney Disease.    Anion gap 9 5 - 15  Urinalysis complete, with microscopic [facility])     Status: Abnormal   Collection Time: 08/14/2014 11:43 AM  Result Value Ref Range   Color, Urine YELLOW (A) YELLOW   APPearance CLEAR (A) CLEAR   Glucose, UA NEGATIVE NEGATIVE mg/dL   Bilirubin Urine NEGATIVE NEGATIVE   Ketones, ur NEGATIVE NEGATIVE mg/dL   Specific Gravity, Urine 1.021 1.005 - 1.030   Hgb urine dipstick 1+ (A) NEGATIVE   pH 7.0 5.0 - 8.0   Protein, ur NEGATIVE NEGATIVE mg/dL   Nitrite NEGATIVE NEGATIVE   Leukocytes, UA NEGATIVE NEGATIVE   RBC / HPF 6-30 0 - 5 RBC/hpf   WBC, UA 0-5 0 - 5 WBC/hpf   Bacteria, UA NONE SEEN NONE SEEN   Squamous Epithelial / LPF 0-5 (A) NONE SEEN   Mucous PRESENT    Ct Abdomen Pelvis Wo Contrast  08/27/2014   CLINICAL DATA:  69 year old male with right flank and abdominal pain, hematuria and leukocytosis.  EXAM: CT ABDOMEN AND PELVIS WITHOUT CONTRAST  TECHNIQUE: Multidetector CT imaging of the abdomen and pelvis was performed following the standard protocol without IV contrast.  COMPARISON:  06/09/2013 chest CT.  FINDINGS: Please note that parenchymal abnormalities may be missed without intravenous contrast.  Lower chest:  Mild bibasilar atelectasis/scarring noted.  Hepatobiliary: A thick wall distended gallbladder containing gallstones with adjacent inflammation is compatible with acute cholecystitis. CBD dilatation (11 mm) is identified with suggestion of choledocholithiasis. The liver is unremarkable.  Pancreas: Unremarkable  Spleen: Unremarkable  Adrenals/Urinary Tract: The kidneys, adrenal glands and bladder are unremarkable except for a left bladder diverticulum.  Stomach/Bowel: A very large hiatal hernia is noted. Colonic diverticulosis noted without  diverticulitis. There is no evidence bowel obstruction or bowel wall thickening. The appendix is normal.  Vascular/Lymphatic: No enlarged lymph nodes or abdominal aortic aneurysm.  Reproductive: Mild prostate enlargement noted.  Other: There is no evidence of free fluid, collection or pneumoperitoneum.  Musculoskeletal: L1 superior endplate compression of 20% is new since 06/09/2013. Further severe compression of T7 is also identified. Bilateral hip effusions are identified, small on the right and small to moderate on the left. Mild degenerative changes within both hips noted.  IMPRESSION: Acute cholecystitis, cholelithiasis, CBD dilatation and probable choledocholithiasis.  New L1 superior endplate compression fracture  and increasing severe compression of T7 since 06/09/2013.  Very large hiatal hernia again noted.   Electronically Signed   By: Margarette Canada M.D.   On: 08/13/2014 13:37     Assessment/Plan CT scan is personally reviewed.  This patient presents with confusing history and physical. Dr. Corky Downs in the ED believe he had a kidney stone by presentation but the CT scan is highly suggestive of gallbladder disease. Having said that the patient has no abdominal pain. He does have some right flank pain posteriorly and on CT scan the fundus of the gallbladder appears to point to the right side.  Do not believe that her ultrasound would add any information to the CT scan. I do however believe that a HIDA scan may be helpful in determining or confirming that this is likely to be acute cholecystitis. I recommended admission to the hospital. I would like to start IV hydration and IV antibiotics and control his nausea. Tomorrow morning a HIDA scan is indicated and scheduled. This may assist in determining the true cause of his right flank pain. This was discussed with Dr. Corky Downs and with nursing. Patient and wife understood and agreed with this plan.  Florene Glen, MD, FACS

## 2014-08-17 ENCOUNTER — Inpatient Hospital Stay: Payer: Medicare HMO

## 2014-08-17 ENCOUNTER — Encounter: Payer: Self-pay | Admitting: Radiology

## 2014-08-17 LAB — COMPREHENSIVE METABOLIC PANEL
ALK PHOS: 119 U/L (ref 38–126)
ALT: 154 U/L — ABNORMAL HIGH (ref 17–63)
AST: 212 U/L — ABNORMAL HIGH (ref 15–41)
Albumin: 3.8 g/dL (ref 3.5–5.0)
Anion gap: 7 (ref 5–15)
BUN: 14 mg/dL (ref 6–20)
CO2: 28 mmol/L (ref 22–32)
Calcium: 8.7 mg/dL — ABNORMAL LOW (ref 8.9–10.3)
Chloride: 105 mmol/L (ref 101–111)
Creatinine, Ser: 0.82 mg/dL (ref 0.61–1.24)
GFR calc Af Amer: >60 mL/min (ref >60)
GFR calc non Af Amer: >60 mL/min (ref >60)
Glucose, Bld: 137 mg/dL — ABNORMAL HIGH (ref 65–99)
Potassium: 3.8 mmol/L (ref 3.5–5.1)
Sodium: 140 mmol/L (ref 135–145)
TOTAL PROTEIN: 6.6 g/dL (ref 6.5–8.1)
Total Bilirubin: 3.6 mg/dL — ABNORMAL HIGH (ref 0.3–1.2)

## 2014-08-17 LAB — CBC
HCT: 45.3 % (ref 40.0–52.0)
HEMOGLOBIN: 14.8 g/dL (ref 13.0–18.0)
MCH: 30.6 pg (ref 26.0–34.0)
MCHC: 32.6 g/dL (ref 32.0–36.0)
MCV: 93.8 fL (ref 80.0–100.0)
PLATELETS: 216 10*3/uL (ref 150–440)
RBC: 4.83 MIL/uL (ref 4.40–5.90)
RDW: 14.3 % (ref 11.5–14.5)
WBC: 9.5 10*3/uL (ref 3.8–10.6)

## 2014-08-17 MED ORDER — KETOROLAC TROMETHAMINE 30 MG/ML IJ SOLN
30.0000 mg | Freq: Three times a day (TID) | INTRAMUSCULAR | Status: AC
  Administered 2014-08-17 – 2014-08-18 (×3): 30 mg via INTRAVENOUS
  Filled 2014-08-17 (×3): qty 1

## 2014-08-17 MED ORDER — TECHNETIUM TC 99M MEBROFENIN IV KIT
7.5000 | PACK | Freq: Once | INTRAVENOUS | Status: AC | PRN
  Administered 2014-08-17: 7.12 via INTRAVENOUS

## 2014-08-18 LAB — COMPREHENSIVE METABOLIC PANEL
ALBUMIN: 3.4 g/dL — AB (ref 3.5–5.0)
ALT: 84 U/L — ABNORMAL HIGH (ref 17–63)
ANION GAP: 7 (ref 5–15)
AST: 63 U/L — ABNORMAL HIGH (ref 15–41)
Alkaline Phosphatase: 103 U/L (ref 38–126)
BILIRUBIN TOTAL: 5.5 mg/dL — AB (ref 0.3–1.2)
BUN: 11 mg/dL (ref 6–20)
CALCIUM: 8.3 mg/dL — AB (ref 8.9–10.3)
CO2: 27 mmol/L (ref 22–32)
Chloride: 105 mmol/L (ref 101–111)
Creatinine, Ser: 0.67 mg/dL (ref 0.61–1.24)
GFR calc Af Amer: >60 mL/min (ref >60)
GFR calc non Af Amer: >60 mL/min (ref >60)
GLUCOSE: 123 mg/dL — AB (ref 65–99)
POTASSIUM: 3.7 mmol/L (ref 3.5–5.1)
Sodium: 139 mmol/L (ref 135–145)
TOTAL PROTEIN: 6.3 g/dL — AB (ref 6.5–8.1)

## 2014-08-18 LAB — CBC
HEMATOCRIT: 40.1 % (ref 40.0–52.0)
Hemoglobin: 13.2 g/dL (ref 13.0–18.0)
MCH: 30.8 pg (ref 26.0–34.0)
MCHC: 33 g/dL (ref 32.0–36.0)
MCV: 93.4 fL (ref 80.0–100.0)
PLATELETS: 185 10*3/uL (ref 150–440)
RBC: 4.3 MIL/uL — AB (ref 4.40–5.90)
RDW: 14.1 % (ref 11.5–14.5)
WBC: 6.4 10*3/uL (ref 3.8–10.6)

## 2014-08-18 LAB — BILIRUBIN, DIRECT: Bilirubin, Direct: 3.2 mg/dL — ABNORMAL HIGH (ref 0.1–0.5)

## 2014-08-18 MED ORDER — SERTRALINE HCL 50 MG PO TABS
50.0000 mg | ORAL_TABLET | Freq: Every day | ORAL | Status: DC
  Administered 2014-08-18 – 2014-08-28 (×7): 50 mg via ORAL
  Filled 2014-08-18 (×7): qty 1

## 2014-08-18 NOTE — Plan of Care (Deleted)
Problem: Phase I Progression Outcomes Goal: Pain controlled with appropriate interventions Outcome: Progressing Pt has required one PRN during my care. Goal: OOB as tolerated unless otherwise ordered Outcome: Not Progressing Pt began bleeding when going to the bathroom yesterday and progressively got worse throughout the night requiring the MD to come to bedside and pack wound. A BR order was placed after that incident and maintained during the day today.  Goal: Initial discharge plan identified Outcome: Not Progressing Continuing to monitor surgical site for bleeding during my shift.  Goal: Voiding-avoid urinary catheter unless indicated Outcome: Progressing Pt is voiding and passing gas on her own during my shift.   Problem: Phase II Progression Outcomes Goal: Tolerating diet Outcome: Not Progressing Pt continues to be NPO during my shift.   Problem: Phase III Progression Outcomes Goal: IV changed to normal saline lock Outcome: Not Progressing Pt continues to require IVF, but rate was reduced from 11500ml/hr to 4550ml/hr during my shift.  Goal: Nasogastric tube discontinued Outcome: Not Progressing Pt's NGT continues to be connected to LIWS during my shift with minimal output.

## 2014-08-18 NOTE — Progress Notes (Signed)
Surgery Progress note  S: No acute issues.  No current pain. O: AF/VSS, gooduop GEN: NAD/A&Ox3 ABD: soft, min tender, nondistended  Bili (yesterday) 3.7  A/P 69 yo admit with ? Cholecystitis, now with hyperbilirubinemia, no filling of CBD on HIDA - f/u am labs - GI consult to eval for ERCP

## 2014-08-18 NOTE — Consult Note (Signed)
Consultation  Referring Provider:    Dr Juliann Pulse  Admit date: 08/29/2014 Consult date: 08/18/14         Reason for Consultation:     Hyperbilirubinemia         HPI:   Alex Allison is a 69 y.o. male  Admitted Sunday with right flank pain. Onset Friday and progressively got worse over the weekend. States he became nauseated after eating, however there was no vomiting. States the pain would travel at times posteriorly to his back, and at other times, anteriorly across his abdomen. Unable to identify any precipitating event: says he has had back pain for years, and thought it was related to this, but it became more severe than normal, so sought help.  Denies GERD, known melena/hematochezia/clay colored stools, problems swallowing, unplanned weight loss.  Was evaluated last year for elevated LFTs- reports this was attributed to medication side effects from his antidepressants and improved with medication changes. Did have negative HAV Igm, negative HBV surface antigen/antibody, and negative HCV antibody last year. Had [place] at that time that demonstrated gallstones and a CBD of 9mm.  At the time of this admission, his liver panel was normal, and as there was a small amount of microscopic hematuria, so it was thought he may have kidney stones. However, yesterday his total bili rose to 3.6, and ast to 212, alt to 154. Had a CT abdomen/pelvis concerning for acute cholecystitis, CD dilation of 11mm, and possible choledocholithiasis. A HIDA scan was done today which Demonstrated poor visualization of the gallbladder and there was poor visualization of the biliary tree.  His AST and ALTs have improved, however bilirubin has risen to 5.5.  States that his pain has improved with pain medications but still persists. He is on Unasyn abx. Aferile and VSS.      Past Medical History  Diagnosis Date  . Depressed   . Anxiety   . Polio   . Hypertension     Past Surgical History  Procedure Laterality Date  . Anal  fissure repair      History reviewed. No pertinent family history.   History  Substance Use Topics  . Smoking status: Never Smoker   . Smokeless tobacco: Not on file  . Alcohol Use: No    Prior to Admission medications   Medication Sig Start Date End Date Taking? Authorizing Provider  Cholecalciferol 2000 UNITS CAPS Take 1 capsule by mouth daily.   Yes Historical Provider, MD  Docusate Sodium 100 MG capsule Take 100 mg by mouth daily.   Yes Historical Provider, MD  gabapentin (NEURONTIN) 100 MG capsule Take 200 mg by mouth 2 (two) times daily.   Yes Historical Provider, MD  lisinopril (PRINIVIL,ZESTRIL) 20 MG tablet Take 20 mg by mouth every morning.   Yes Historical Provider, MD  Melatonin 5 MG TABS Take 1 tablet by mouth at bedtime.   Yes Historical Provider, MD  mirtazapine (REMERON) 45 MG tablet Take 22.5 mg by mouth at bedtime.   Yes Historical Provider, MD  OLANZapine (ZYPREXA) 2.5 MG tablet Take 2.5 mg by mouth at bedtime.   Yes Historical Provider, MD  sertraline (ZOLOFT) 50 MG tablet Take 50 mg by mouth daily.   Yes Historical Provider, MD  traZODone (DESYREL) 50 MG tablet Take 50 mg by mouth at bedtime.   Yes Historical Provider, MD  OLANZapine (ZYPREXA) 5 MG tablet Take 5 mg by mouth 2 (two) times daily.    Historical Provider, MD    Current  Facility-Administered Medications  Medication Dose Route Frequency Provider Last Rate Last Dose  . Ampicillin-Sulbactam (UNASYN) 3 g in sodium chloride 0.9 % 100 mL IVPB  3 g Intravenous Q6H Lattie Hawichard E Cooper, MD   3 g at 08/18/14 0935  . dextrose 5 % in lactated ringers infusion   Intravenous Continuous Lattie Hawichard E Cooper, MD 125 mL/hr at 08/18/14 [phone]    . gabapentin (NEURONTIN) capsule 200 mg  200 mg Oral BID Natale LayMark Bird, MD   200 mg at 08/18/14 0935  . heparin injection 5,000 Units  5,000 Units Subcutaneous 3 times per day Lattie Hawichard E Cooper, MD   5,000 Units at 08/18/14 0544  . mirtazapine (REMERON) tablet 22.5 mg  22.5 mg Oral QHS Natale LayMark  Bird, MD   22.5 mg at 08/17/14 2201  . morphine 2 MG/ML injection 2 mg  2 mg Intravenous Q2H PRN Lattie Hawichard E Cooper, MD   2 mg at 08/18/14 0804  . OLANZapine (ZYPREXA) tablet 2.5 mg  2.5 mg Oral QHS Natale LayMark Bird, MD   2.5 mg at 08/17/14 2201  . ondansetron (ZOFRAN) tablet 4 mg  4 mg Oral Q6H PRN Lattie Hawichard E Cooper, MD       Or  . ondansetron [facility](ZOFRAN) injection 4 mg  4 mg Intravenous Q6H PRN Lattie Hawichard E Cooper, MD      . pantoprazole (PROTONIX) injection 40 mg  40 mg Intravenous Q12H Lattie Hawichard E Cooper, MD   40 mg at 08/18/14 0935  . sertraline (ZOLOFT) tablet 50 mg  50 mg Oral Daily Ida Roguehristopher Lundquist, MD   50 mg at 08/18/14 0935  . traZODone (DESYREL) tablet 50 mg  50 mg Oral QHS Natale LayMark Bird, MD   50 mg at 08/17/14 2153    Allergies as of 08/25/2014  . (No Known Allergies)     Review of Systems:    All systems reviewed and negative except where noted in HPI.  Gen: Denies any fever, chills, sweats, anorexia, fatigue, weakness, malaise, weight loss, and sleep disorder CV: Denies chest pain, angina, palpitations, syncope, orthopnea, PND, peripheral edema, and claudication. Resp: Denies dyspnea at rest, dyspnea with exercise, cough, sputum, wheezing, coughing up blood, and pleurisy. GI: Denies vomiting blood, jaundice, and fecal incontinence.   Denies dysphagia or odynophagia. GU : Denies urinary burning, blood in urine, urinary frequency, urinary hesitancy, nocturnal urination, and urinary incontinence. MS: Denies joint pain, limitation of movement, and swelling, stiffness, low back pain, extremity pain. Denies muscle weakness, cramps, atrophy.  Derm: Denies rash, itching, dry skin, hives, moles, warts, or unhealing ulcers.  Psych: Denies depression, anxiety, memory loss, suicidal ideation, hallucinations, paranoia, and confusion. Heme: Denies bruising, bleeding, and enlarged lymph nodes. Neuro:  Denies any headaches, dizziness, paresthesias. Endo:  Denies any problems with DM, thyroid, adrenal  function.    Physical Exam:  Vital signs in last 24 hours: Temp:  [97.3 F (36.3 C)-99.4 F (37.4 C)] 98.1 F (36.7 C) (05/17 0752) Pulse Rate:  [87-93] 87 (05/17 0752) Resp:  [16-20] 20 (05/17 0752) BP: (129-151)/(62-88) 137/73 mmHg (05/17 0752) SpO2:  [93 %-100 %] 93 % (05/17 0752) Weight:  [99.746 kg (219 lb 14.4 oz)-100.653 kg (221 lb 14.4 oz)] 100.653 kg (221 lb 14.4 oz) (05/17 0500) Last BM Date: 08/15/14 General:   Pleasant adult man in NAD Head:  Normocephalic and atraumatic. Eyes:   Mild scleral icterus.   Conjunctiva pink. Ears:  Normal auditory acuity. Mouth: Mucosa pink moist, no lesions. Neck:  Supple; no masses felt Lungs: Respirations even and unlabored.  Lungs clear to auscultation bilaterally.   No wheezes, crackles, or rhonchi.  Heart:  S1S2, RRR, no MRG. No edema. Abdomen:   Flat, soft, nondistended, some ruq tenderness. Normal bowel sounds. No appreciable masses or hepatomegaly. No rebound signs or other peritoneal signs. Rectal:  Not performed.  Msk:  MAEW x4, No clubbing or cyanosis. Strength 5/5. Symmetrical without gross deformities. Neurologic:  Alert and  oriented x4;  Cranial nerves II-XII intact.  Skin:  Warm, dry, mild jaundice without significant lesions or rashes. Psych:  Alert and cooperative. Normal affect.  LAB RESULTS:  Recent Labs  07/02/2014 1143 08/17/14 0436 08/18/14 0717  WBC 12.9* 9.5 6.4  HGB 15.8 14.8 13.2  HCT 48.1 45.3 40.1  PLT 252 216 185   BMET  Recent Labs  07/02/2014 1143 08/17/14 0436 08/18/14 0717  NA 140 140 139  K 4.2 3.8 3.7  CL 104 105 105  CO2 27 28 27   GLUCOSE 135* 137* 123*  BUN 13 14 11   CREATININE 0.87 0.82 0.67  CALCIUM 9.1 8.7* 8.3*   LFT  Recent Labs  08/18/14 0717  PROT 6.3*  ALBUMIN 3.4*  AST 63*  ALT 84*  ALKPHOS 103  BILITOT 5.5*   PT/INR No results for input(s): LABPROT, INR in the last 72 hours.  STUDIES: Nm Hepatobiliary Liver Func  08/17/2014   CLINICAL DATA:  Right upper  quadrant pain with cholelithiasis  EXAM: NUCLEAR MEDICINE HEPATOBILIARY IMAGING  TECHNIQUE: Sequential images of the abdomen were obtained out to 60 minutes following intravenous administration of radiopharmaceutical.  RADIOPHARMACEUTICALS:  7.12 mCi Tc-6477m Choletec IV  COMPARISON:  2015/02/13  FINDINGS: There is adequate uptake of radioactive tracer throughout the liver. Imaging was obtained to for hours following injection and there is no significant opacification of the biliary tree. Portion of this may be related to the diffuse ductal dilatation seen on recent CT examination. Additionally some the pad O cellular dysfunction may be present. The gallbladder is not visualized as well and this is likely related to the poor visualization of the biliary tree  IMPRESSION: No significant excretion of biliary tracer into the bile ducts likely related to the degree of biliary ductal dilatation seen on recent CT examination. The gallbladder is also not visualize although this is nonspecific given the lack of biliary opacification.   Electronically Signed   By: Alcide CleverMark  Lukens M.D.   On: 08/17/2014 13:01       Impression / Plan:   1. Hyperbilirubinemia. Suspected cholecysitis and abnormal HIDa. Will assess direct bilirubin- however do think he would likely benefit from cholecystectomy: will discuss possible ERCP with Dr Marva PandaSkulskie due to concerns of choledocholithiasis, although he may also benefit from intraoperative cholangiogram.  2. Of note, he does have a new compression (lumbar) fracture- may need ortho opinion.   Thank you very much for this consult. These services were provided by Vevelyn Pathristiane Zetha Kuhar, NP-C, in collaboration with Christena DeemMartin U Skulskie, MD, with whom I have discussed this patient in full.   Vevelyn Pathristiane Ambrielle Kington, NP-C  Addendum: Have discussed with Dr Marva PandaSkulskie- do recommend ERCP due to dilated CBD, increasing bilirubin, abnormal HIDA- consider cholecystectomy to follow

## 2014-08-18 NOTE — Plan of Care (Signed)
Problem: Phase I Progression Outcomes Goal: Pain controlled with appropriate interventions Outcome: Progressing Pain controlled by PRN pain medications during my shift.

## 2014-08-18 NOTE — Consult Note (Signed)
Subjective: Patient seen for hyperbilirubinemia. Patient seen and examined, chart reviewed, please see full GI consult .  Objective: Vital signs in last 24 hours: Temp:  [97.3 F (36.3 C)-98.5 F (36.9 C)] 98.3 F (36.8 C) (05/17 1707) Pulse Rate:  [87-94] 94 (05/17 1707) Resp:  [16-20] 19 (05/17 1707) BP: (129-151)/(62-78) 143/78 mmHg (05/17 1707) SpO2:  [93 %-100 %] 94 % (05/17 1707) Weight:  [99.746 kg (219 lb 14.4 oz)-100.653 kg (221 lb 14.4 oz)] 100.653 kg (221 lb 14.4 oz) (05/17 0500) Blood pressure 143/78, pulse 94, temperature 98.3 F (36.8 C), temperature source Oral, resp. rate 19, height  (1.803 m), weight 100.653 kg (221 lb 14.4 oz), SpO2 94 %.   Intake/Output from previous day: 05/16 0701 - 05/17 0700 In: 2718 [P.O.:120; I.V.:2498; IV Piggyback:100] Out: 420 [Urine:420]  Intake/Output this shift: Total I/O In: 1296.4 [I.V.:1296.4] Out: 425 [Urine:425]   General appearance:  Jaundice no acute distress. H and states he is feeling better in regards to the abdominal / flank pain that he came into the hospital with however is having difficulty with some back pain. Resp:  Clear to auscultation Cardio:  Regular rate and rhythm GI: Protuberant and her to palpation in the epigastric region extending into the right upper quadrant. There are no masses or rebound. Sounds are positive. Extremities:     Lab Results: Results for orders placed or performed during the hospital encounter of 08-23-2014 (from the past 24 hour(s))  CBC     Status: Abnormal   Collection Time: 08/18/14  7:17 AM  Result Value Ref Range   WBC 6.4 3.8 - 10.6 K/uL   RBC 4.30 (L) 4.40 - 5.90 MIL/uL   Hemoglobin 13.2 13.0 - 18.0 g/dL   HCT 78.2 95.6 - 21.3 %   MCV 93.4 80.0 - 100.0 fL   MCH 30.8 26.0 - 34.0 pg   MCHC 33.0 32.0 - 36.0 g/dL   RDW 08.6 57.8 - 46.9 %   Platelets 185 150 - 440 K/uL  Comprehensive metabolic panel     Status: Abnormal   Collection Time: 08/18/14  7:17 AM  Result Value  Ref Range   Sodium 139 135 - 145 mmol/L   Potassium 3.7 3.5 - 5.1 mmol/L   Chloride 105 101 - 111 mmol/L   CO2 27 22 - 32 mmol/L   Glucose, Bld 123 (H) 65 - 99 mg/dL   BUN 11 6 - 20 mg/dL   Creatinine, Ser 6.29 0.61 - 1.24 mg/dL   Calcium 8.3 (L) 8.9 - 10.3 mg/dL   Total Protein 6.3 (L) 6.5 - 8.1 g/dL   Albumin 3.4 (L) 3.5 - 5.0 g/dL   AST 63 (H) 15 - 41 U/L   ALT 84 (H) 17 - 63 U/L   Alkaline Phosphatase 103 38 - 126 U/L   Total Bilirubin 5.5 (H) 0.3 - 1.2 mg/dL   GFR calc non Af Amer >60 >60 mL/min   GFR calc Af Amer >60 >60 mL/min   Anion gap 7 5 - 15  Bilirubin, direct     Status: Abnormal   Collection Time: 08/18/14  7:17 AM  Result Value Ref Range   Bilirubin, Direct 3.2 (H) 0.1 - 0.5 mg/dL      Recent Labs  52/84/13 1143 08/17/14 0436 08/18/14 0717  WBC 12.9* 9.5 6.4  HGB 15.8 14.8 13.2  HCT 48.1 45.3 40.1  PLT 252 216 185   BMET  Recent Labs  2014-08-23 1143 08/17/14 0436 08/18/14 0717  NA 140 140 139  K 4.2 3.8 3.7  CL 104 105 105  CO2 GLUCOSE 135* 137* 123*  BUN CREATININE 0.87 0.82 0.67  CALCIUM 9.1 8.7* 8.3*   LFT  Recent Labs  08/18/14 0717  PROT 6.3*  ALBUMIN 3.4*  AST 63*  ALT 84*  ALKPHOS 103  BILITOT 5.5*  BILIDIR 3.2*   PT/INR No results for input(s): LABPROT, INR in the last 72 hours. Hepatitis Panel No results for input(s): HEPBSAG, HCVAB, HEPAIGM, HEPBIGM in the last 72 hours. C-Diff No results for input(s): CDIFFTOX in the last 72 hours. No results for input(s): CDIFFPCR in the last 72 hours.   Studies/Results: Nm Hepatobiliary Liver Func  08/17/2014   CLINICAL DATA:  Right upper quadrant pain with cholelithiasis  EXAM: NUCLEAR MEDICINE HEPATOBILIARY IMAGING  TECHNIQUE: Sequential images of the abdomen were obtained out to 60 minutes following intravenous administration of radiopharmaceutical.  RADIOPHARMACEUTICALS:  7.12 mCi Tc-43m Choletec IV  COMPARISON:  08/05/2014  FINDINGS: There is adequate  uptake of radioactive tracer throughout the liver. Imaging was obtained to for hours following injection and there is no significant opacification of the biliary tree. Portion of this may be related to the diffuse ductal dilatation seen on recent CT examination. Additionally some the pad O cellular dysfunction may be present. The gallbladder is not visualized as well and this is likely related to the poor visualization of the biliary tree  IMPRESSION: No significant excretion of biliary tracer into the bile ducts likely related to the degree of biliary ductal dilatation seen on recent CT examination. The gallbladder is also not visualize although this is nonspecific given the lack of biliary opacification.   Electronically Signed   By: Alcide Clever M.D.   On: 08/17/2014 13:01    Scheduled Inpatient Medications:   . ampicillin-sulbactam (UNASYN) IV  3 g Intravenous Q6H  . gabapentin  200 mg Oral BID  . heparin  5,000 Units Subcutaneous 3 times per day  . mirtazapine  22.5 mg Oral QHS  . OLANZapine  2.5 mg Oral QHS  . pantoprazole (PROTONIX) IV  40 mg Intravenous Q12H  . sertraline  50 mg Oral Daily  . traZODone  50 mg Oral QHS    Continuous Inpatient Infusions:   . dextrose 5% lactated ringers 125 mL/hr at 08/18/14 1449    PRN Inpatient Medications:  morphine injection, ondansetron **OR** ondansetron (ZOFRAN) IV  Miscellaneous: Clubbing cyanosis or edema  Assessment:  Elevated bilirubin in the setting of imaging consistent with cholecystitis/cholelithiasis, common bile duct dilatation and probable choledocholithiasis.  Plan:  Continue antibiotics as you are. Recommend ERCP. I discussed with Dr. Bluford Kaufmann and he will see patient tomorrow and plans for ERCP.  Christena Deem MD 08/18/2014, 6:35 PM  Subjective: Patient seen for   Objective: Vital signs in last 24 hours: Temp:  [97.3 F (36.3 C)-98.5 F (36.9 C)] 98.3 F (36.8 C) (05/17 1707) Pulse Rate:  [87-94] 94 (05/17  1707) Resp:  [16-20] 19 (05/17 1707) BP: (129-151)/(62-78) 143/78 mmHg (05/17 1707) SpO2:  [93 %-100 %] 94 % (05/17 1707) Weight:  [99.746 kg (219 lb 14.4 oz)-100.653 kg (221 lb 14.4 oz)] 100.653 kg (221 lb 14.4 oz) (05/17 0500) Blood pressure 143/78, pulse 94, temperature 98.3 F (36.8 C), temperature source Oral, resp. rate 19, height  (1.803 m), weight 100.653 kg (221 lb 14.4 oz), SpO2 94 %.   Intake/Output from previous day: 05/16 0701 - 05/17 0700  In: 2718 [P.O.:120; I.V.:2498; IV Piggyback:100] Out: 420 [Urine:420]  Intake/Output this shift: Total I/O In: 1296.4 [I.V.:1296.4] Out: 425 [Urine:425]   General appearance:   Resp:   Cardio:   GI:   Extremities:     Lab Results: Results for orders placed or performed during the hospital encounter of 08/14/2014 (from the past 24 hour(s))  CBC     Status: Abnormal   Collection Time: 08/18/14  7:17 AM  Result Value Ref Range   WBC 6.4 3.8 - 10.6 K/uL   RBC 4.30 (L) 4.40 - 5.90 MIL/uL   Hemoglobin 13.2 13.0 - 18.0 g/dL   HCT 16.140.1 09.640.0 - 04.552.0 %   MCV 93.4 80.0 - 100.0 fL   MCH 30.8 26.0 - 34.0 pg   MCHC 33.0 32.0 - 36.0 g/dL   RDW 40.914.1 81.111.5 - 91.414.5 %   Platelets 185 150 - 440 K/uL  Comprehensive metabolic panel     Status: Abnormal   Collection Time: 08/18/14  7:17 AM  Result Value Ref Range   Sodium 139 135 - 145 mmol/L   Potassium 3.7 3.5 - 5.1 mmol/L   Chloride 105 101 - 111 mmol/L   CO2 27 22 - 32 mmol/L   Glucose, Bld 123 (H) 65 - 99 mg/dL   BUN 11 6 - 20 mg/dL   Creatinine, Ser 7.820.67 0.61 - 1.24 mg/dL   Calcium 8.3 (L) 8.9 - 10.3 mg/dL   Total Protein 6.3 (L) 6.5 - 8.1 g/dL   Albumin 3.4 (L) 3.5 - 5.0 g/dL   AST 63 (H) 15 - 41 U/L   ALT 84 (H) 17 - 63 U/L   Alkaline Phosphatase 103 38 - 126 U/L   Total Bilirubin 5.5 (H) 0.3 - 1.2 mg/dL   GFR calc non Af Amer >60 >60 mL/min   GFR calc Af Amer >60 >60 mL/min   Anion gap 7 5 - 15  Bilirubin, direct     Status: Abnormal   Collection Time: 08/18/14  7:17 AM   Result Value Ref Range   Bilirubin, Direct 3.2 (H) 0.1 - 0.5 mg/dL      Recent Labs  95/62/1305/28/2016 1143 08/17/14 0436 08/18/14 0717  WBC 12.9* 9.5 6.4  HGB 15.8 14.8 13.2  HCT 48.1 45.3 40.1  PLT 252 216 185   BMET  Recent Labs  08/27/2014 1143 08/17/14 0436 08/18/14 0717  NA 140 140 139  K 4.2 3.8 3.7  CL 104 105 105  CO2 27 28 27   GLUCOSE 135* 137* 123*  BUN 13 14 11   CREATININE 0.87 0.82 0.67  CALCIUM 9.1 8.7* 8.3*   LFT  Recent Labs  08/18/14 0717  PROT 6.3*  ALBUMIN 3.4*  AST 63*  ALT 84*  ALKPHOS 103  BILITOT 5.5*  BILIDIR 3.2*   PT/INR No results for input(s): LABPROT, INR in the last 72 hours. Hepatitis Panel No results for input(s): HEPBSAG, HCVAB, HEPAIGM, HEPBIGM in the last 72 hours. C-Diff No results for input(s): CDIFFTOX in the last 72 hours. No results for input(s): CDIFFPCR in the last 72 hours.   Studies/Results: Nm Hepatobiliary Liver Func  08/17/2014   CLINICAL DATA:  Right upper quadrant pain with cholelithiasis  EXAM: NUCLEAR MEDICINE HEPATOBILIARY IMAGING  TECHNIQUE: Sequential images of the abdomen were obtained out to 60 minutes following intravenous administration of radiopharmaceutical.  RADIOPHARMACEUTICALS:  7.12 mCi Tc-3221m Choletec IV  COMPARISON:  08/03/2014  FINDINGS: There is adequate uptake of radioactive tracer throughout the liver. Imaging was obtained to  for hours following injection and there is no significant opacification of the biliary tree. Portion of this may be related to the diffuse ductal dilatation seen on recent CT examination. Additionally some the pad O cellular dysfunction may be present. The gallbladder is not visualized as well and this is likely related to the poor visualization of the biliary tree  IMPRESSION: No significant excretion of biliary tracer into the bile ducts likely related to the degree of biliary ductal dilatation seen on recent CT examination. The gallbladder is also not visualize although this is  nonspecific given the lack of biliary opacification.   Electronically Signed   By: Alcide CleverMark  Lukens M.D.   On: 08/17/2014 13:01    Scheduled Inpatient Medications:   . ampicillin-sulbactam (UNASYN) IV  3 g Intravenous Q6H  . gabapentin  200 mg Oral BID  . heparin  5,000 Units Subcutaneous 3 times per day  . mirtazapine  22.5 mg Oral QHS  . OLANZapine  2.5 mg Oral QHS  . pantoprazole (PROTONIX) IV  40 mg Intravenous Q12H  . sertraline  50 mg Oral Daily  . traZODone  50 mg Oral QHS    Continuous Inpatient Infusions:   . dextrose 5% lactated ringers 125 mL/hr at 08/18/14 1449    PRN Inpatient Medications:  morphine injection, ondansetron **OR** ondansetron (ZOFRAN) IV  Miscellaneous:   Assessment:    Plan:  Please see note above  Christena DeemMartin U Mostyn Varnell MD 08/18/2014, 6:35 PM

## 2014-08-19 ENCOUNTER — Inpatient Hospital Stay: Payer: Medicare HMO

## 2014-08-19 ENCOUNTER — Encounter: Admission: EM | Disposition: E | Payer: Self-pay | Source: Home / Self Care | Attending: Surgery

## 2014-08-19 ENCOUNTER — Inpatient Hospital Stay: Payer: Medicare HMO | Admitting: *Deleted

## 2014-08-19 ENCOUNTER — Encounter: Payer: Self-pay | Admitting: Gastroenterology

## 2014-08-19 DIAGNOSIS — R0902 Hypoxemia: Secondary | ICD-10-CM

## 2014-08-19 LAB — COMPREHENSIVE METABOLIC PANEL
ALT: 75 U/L — AB (ref 17–63)
AST: 60 U/L — ABNORMAL HIGH (ref 15–41)
Albumin: 3.1 g/dL — ABNORMAL LOW (ref 3.5–5.0)
Alkaline Phosphatase: 98 U/L (ref 38–126)
Anion gap: 8 (ref 5–15)
BILIRUBIN TOTAL: 5.8 mg/dL — AB (ref 0.3–1.2)
BUN: 10 mg/dL (ref 6–20)
CHLORIDE: 105 mmol/L (ref 101–111)
CO2: 26 mmol/L (ref 22–32)
Calcium: 8.1 mg/dL — ABNORMAL LOW (ref 8.9–10.3)
Creatinine, Ser: 0.67 mg/dL (ref 0.61–1.24)
GFR calc Af Amer: >60 mL/min (ref >60)
GFR calc non Af Amer: >60 mL/min (ref >60)
GLUCOSE: 138 mg/dL — AB (ref 65–99)
POTASSIUM: 3.8 mmol/L (ref 3.5–5.1)
SODIUM: 139 mmol/L (ref 135–145)
TOTAL PROTEIN: 5.6 g/dL — AB (ref 6.5–8.1)

## 2014-08-19 LAB — BLOOD GAS, ARTERIAL
ACID-BASE DEFICIT: 2.8 mmol/L — AB (ref 0.0–2.0)
Allens test (pass/fail): POSITIVE — AB
BICARBONATE: 24.1 meq/L (ref 21.0–28.0)
FIO2: 40 %
O2 SAT: 86.5 %
PCO2 ART: 49 mmHg — AB (ref 32.0–48.0)
PEEP: 5 cmH2O
PO2 ART: 58 mmHg — AB (ref 83.0–108.0)
Patient temperature: 37
RATE: 15 resp/min
VT: 500 mL
pH, Arterial: 7.3 — ABNORMAL LOW (ref 7.350–7.450)

## 2014-08-19 LAB — PLATELET COUNT: PLATELETS: 180 10*3/uL (ref 150–440)

## 2014-08-19 LAB — GLUCOSE, CAPILLARY: GLUCOSE-CAPILLARY: 156 mg/dL — AB (ref 65–99)

## 2014-08-19 SURGERY — ERCP, WITH INTERVENTION IF INDICATED
Anesthesia: General

## 2014-08-19 MED ORDER — PROPOFOL 10 MG/ML IV BOLUS
INTRAVENOUS | Status: DC | PRN
  Administered 2014-08-19: 70 mg via INTRAVENOUS
  Administered 2014-08-19: 130 mg via INTRAVENOUS

## 2014-08-19 MED ORDER — LABETALOL HCL 5 MG/ML IV SOLN
INTRAVENOUS | Status: DC | PRN
  Administered 2014-08-19: 10 mg via INTRAVENOUS

## 2014-08-19 MED ORDER — MIDAZOLAM HCL 2 MG/2ML IJ SOLN
2.0000 mg | Freq: Once | INTRAMUSCULAR | Status: DC
  Administered 2014-08-19: 2 mg via INTRAVENOUS

## 2014-08-19 MED ORDER — CHLORHEXIDINE GLUCONATE 0.12 % MT SOLN
15.0000 mL | Freq: Two times a day (BID) | OROMUCOSAL | Status: DC
  Administered 2014-08-19 – 2014-08-28 (×13): 15 mL via OROMUCOSAL

## 2014-08-19 MED ORDER — MIDAZOLAM HCL 5 MG/5ML IJ SOLN
2.0000 mg | INTRAMUSCULAR | Status: DC | PRN
  Administered 2014-08-19 (×4): 2 mg via INTRAVENOUS

## 2014-08-19 MED ORDER — METHYLPREDNISOLONE SODIUM SUCC 125 MG IJ SOLR
60.0000 mg | Freq: Three times a day (TID) | INTRAMUSCULAR | Status: DC
  Administered 2014-08-19 – 2014-08-20 (×2): 60 mg via INTRAVENOUS
  Filled 2014-08-19 (×2): qty 2

## 2014-08-19 MED ORDER — PROPOFOL 1000 MG/100ML IV EMUL
INTRAVENOUS | Status: AC
Start: 2014-08-19 — End: 2014-08-19
  Administered 2014-08-19: 15.889 ug/kg/min via INTRAVENOUS
  Filled 2014-08-19: qty 100

## 2014-08-19 MED ORDER — DEXAMETHASONE SODIUM PHOSPHATE 4 MG/ML IJ SOLN
INTRAMUSCULAR | Status: DC | PRN
  Administered 2014-08-19: 10 mg via INTRAVENOUS

## 2014-08-19 MED ORDER — SODIUM CHLORIDE 0.9 % IV SOLN
1.0000 mg/h | INTRAVENOUS | Status: DC
  Administered 2014-08-19: 5 mg/h via INTRAVENOUS
  Filled 2014-08-19: qty 10

## 2014-08-19 MED ORDER — MIDAZOLAM HCL 2 MG/2ML IJ SOLN
2.0000 mg | Freq: Once | INTRAMUSCULAR | Status: AC
  Administered 2014-08-19 (×2): 2 mg via INTRAVENOUS

## 2014-08-19 MED ORDER — HYDROMORPHONE HCL 1 MG/ML IJ SOLN: 0.2500 mg | INTRAMUSCULAR | Status: DC | PRN

## 2014-08-19 MED ORDER — SODIUM CHLORIDE 0.9 % IJ SOLN
INTRAMUSCULAR | Status: DC | PRN
  Administered 2014-08-19: 20 mL

## 2014-08-19 MED ORDER — NOREPINEPHRINE BITARTRATE 1 MG/ML IV SOLN
0.0000 ug/min | INTRAVENOUS | Status: DC
  Administered 2014-08-19 (×2): 10 ug/min via INTRAVENOUS

## 2014-08-19 MED ORDER — MIDAZOLAM HCL 5 MG/5ML IJ SOLN
1.0000 mg | Freq: Once | INTRAMUSCULAR | Status: DC
  Administered 2014-08-19: 1 mg via INTRAVENOUS

## 2014-08-19 MED ORDER — NEOSTIGMINE METHYLSULFATE 10 MG/10ML IV SOLN
INTRAVENOUS | Status: DC | PRN
  Administered 2014-08-19: 3 mg via INTRAVENOUS
  Administered 2014-08-19: 2 mg via INTRAVENOUS

## 2014-08-19 MED ORDER — PROPOFOL 1000 MG/100ML IV EMUL
5.0000 ug/kg/min | INTRAVENOUS | Status: DC
  Administered 2014-08-19: 20 ug/kg/min via INTRAVENOUS
  Administered 2014-08-19: 15.889 ug/kg/min via INTRAVENOUS
  Administered 2014-08-20: 31.943 ug/kg/min via INTRAVENOUS
  Administered 2014-08-20: 20 ug/kg/min via INTRAVENOUS
  Administered 2014-08-20: 26.647 ug/kg/min via INTRAVENOUS
  Administered 2014-08-20 (×2): 31.943 ug/kg/min via INTRAVENOUS
  Administered 2014-08-20 (×2): 20 ug/kg/min via INTRAVENOUS
  Administered 2014-08-20: 15.889 ug/kg/min via INTRAVENOUS
  Administered 2014-08-21: 10 ug/kg/min via INTRAVENOUS
  Filled 2014-08-19 (×5): qty 100

## 2014-08-19 MED ORDER — FENTANYL CITRATE (PF) 100 MCG/2ML IJ SOLN
INTRAMUSCULAR | Status: DC | PRN
  Administered 2014-08-19: 50 ug via INTRAVENOUS

## 2014-08-19 MED ORDER — FUROSEMIDE 10 MG/ML IJ SOLN
40.0000 mg | Freq: Every day | INTRAMUSCULAR | Status: AC
  Administered 2014-08-20 – 2014-08-22 (×3): 40 mg via INTRAVENOUS
  Filled 2014-08-19 (×3): qty 4

## 2014-08-19 MED ORDER — SODIUM CHLORIDE 0.9 % IV SOLN
INTRAVENOUS | Status: DC
  Administered 2014-08-19 (×2): via INTRAVENOUS

## 2014-08-19 MED ORDER — VECURONIUM BROMIDE 10 MG IV SOLR
10.0000 mg | Freq: Once | INTRAVENOUS | Status: AC
  Administered 2014-08-19: 10 mg via INTRAVENOUS

## 2014-08-19 MED ORDER — MIDAZOLAM HCL 2 MG/2ML IJ SOLN
INTRAMUSCULAR | Status: DC | PRN
  Administered 2014-08-19 (×2): 2 mg via INTRAVENOUS

## 2014-08-19 MED ORDER — MIDAZOLAM HCL 5 MG/5ML IJ SOLN
INTRAMUSCULAR | Status: AC
  Administered 2014-08-19: 1 mg via INTRAVENOUS
  Filled 2014-08-19: qty 5

## 2014-08-19 MED ORDER — ROCURONIUM BROMIDE 100 MG/10ML IV SOLN
INTRAVENOUS | Status: DC | PRN
  Administered 2014-08-19: 20 mg via INTRAVENOUS

## 2014-08-19 MED ORDER — NOREPINEPHRINE 4 MG/250ML-% IV SOLN
INTRAVENOUS | Status: AC
  Administered 2014-08-19: 10 ug/min via INTRAVENOUS
  Filled 2014-08-19: qty 250

## 2014-08-19 MED ORDER — MIDAZOLAM HCL 5 MG/5ML IJ SOLN
INTRAMUSCULAR | Status: AC
  Administered 2014-08-19: 2 mg via INTRAVENOUS
  Filled 2014-08-19: qty 5

## 2014-08-19 MED ORDER — FUROSEMIDE 10 MG/ML IJ SOLN
INTRAMUSCULAR | Status: AC
  Administered 2014-08-19: 40 mg via INTRAVENOUS
  Filled 2014-08-19: qty 4

## 2014-08-19 MED ORDER — PHENYLEPHRINE HCL 10 MG/ML IJ SOLN
INTRAMUSCULAR | Status: DC | PRN
  Administered 2014-08-19 (×2): 100 ug via INTRAVENOUS

## 2014-08-19 MED ORDER — FUROSEMIDE 10 MG/ML IJ SOLN
40.0000 mg | Freq: Two times a day (BID) | INTRAMUSCULAR | Status: DC
  Administered 2014-08-19: 40 mg via INTRAVENOUS

## 2014-08-19 MED ORDER — DEXTROSE 5 % IV SOLN
0.0000 ug/min | INTRAVENOUS | Status: DC
  Filled 2014-08-19: qty 1

## 2014-08-19 MED ORDER — GLYCOPYRROLATE 0.2 MG/ML IJ SOLN
INTRAMUSCULAR | Status: DC | PRN
  Administered 2014-08-19 (×2): 0.4 mg via INTRAVENOUS

## 2014-08-19 MED ORDER — LIDOCAINE HCL (CARDIAC) 20 MG/ML IV SOLN
INTRAVENOUS | Status: DC | PRN
  Administered 2014-08-19: 60 mg via INTRAVENOUS

## 2014-08-19 NOTE — Anesthesia Procedure Notes (Addendum)
Procedure Name: Intubation Date/Time: 08/14/2014 3:17 PM Performed by: Michaele OfferSAVAGE, Zondra Lawlor Pre-anesthesia Checklist: Patient identified, Emergency Drugs available, Suction available, Patient being monitored and Timeout performed Patient Re-evaluated:Patient Re-evaluated prior to inductionOxygen Delivery Method: Circle system utilized Preoxygenation: Pre-oxygenation with 100% oxygen Intubation Type: IV induction and Cricoid Pressure applied Ventilation: Mask ventilation without difficulty Laryngoscope Size: McGraph and 4 Grade View: Grade II Tube type: Oral Tube size: 7.0 mm Number of attempts: 2 (first attempted with MAC 4 able to see Grade II view ETT would not pass, used MCgrath ETT passed with large curve on end) Airway Equipment and Method: Stylet and Bite block Placement Confirmation: ETT inserted through vocal cords under direct vision,  positive ETCO2 and breath sounds checked- equal and bilateral Secured at: 23 cm Tube secured with: Tape Dental Injury: Teeth and Oropharynx as per pre-operative assessment  Difficulty Due To: Difficulty was unanticipated Future Recommendations: Recommend- induction with short-acting agent, and alternative techniques readily available   Procedure Name: Intubation Date/Time: 08/07/2014 4:27 PM Performed by: Michaele OfferSAVAGE, Brandin Dilday Pre-anesthesia Checklist: Patient identified, Emergency Drugs available, Suction available, Patient being monitored and Timeout performed Patient Re-evaluated:Patient Re-evaluated prior to inductionOxygen Delivery Method: Circle system utilized Preoxygenation: Pre-oxygenation with 100% oxygen Intubation Type: IV induction and Cricoid Pressure applied Ventilation: Two handed mask ventilation required and Oral airway inserted - appropriate to patient size Laryngoscope Size: Glidescope and 4 Grade View: Grade II Tube type: Oral Tube size: 7.0 mm Number of attempts: 3 Airway Equipment and Method: Bougie stylet Placement Confirmation: ETT  inserted through vocal cords under direct vision and positive ETCO2 Secured at: 24 cm Tube secured with: Tape Difficulty Due To: Difficulty was unanticipated Future Recommendations: Recommend- induction with short-acting agent, and alternative techniques readily available Comments: Grade II seen with glidescope by dr Pernell Dupreadams unable to advance tube through vocal cords with glide stylet in place, used bougie and got ETT through vocal cords.  ETCO2 present sats 100%, TV 500s

## 2014-08-19 NOTE — OR Nursing (Signed)
CRNAs present BP low, medicated by CRNA with a total of 200 mcg of neosynephrine.  titrating vent settings. Called for stat EKG and CXR.

## 2014-08-19 NOTE — Consult Note (Signed)
  GI Inpatient Follow-up Note  Patient Identification: Alex Allison is a 69 y.o. male with cholecystitis and elevated LFT. Asked to see pt by Dr. Marva PandaSkulskie for possible ERCP.  Subjective: Currently without abdominal pain but getting morphine q 2-3 hrs. Urine still dark. T.bili remain elevated. Held breakfast and heparin this AM.  Scheduled Inpatient Medications:  . ampicillin-sulbactam (UNASYN) IV  3 g Intravenous Q6H  . gabapentin  200 mg Oral BID  . mirtazapine  22.5 mg Oral QHS  . OLANZapine  2.5 mg Oral QHS  . pantoprazole (PROTONIX) IV  40 mg Intravenous Q12H  . sertraline  50 mg Oral Daily  . traZODone  50 mg Oral QHS    Continuous Inpatient Infusions:   . dextrose 5% lactated ringers 125 mL/hr at 08/27/2014 0924    PRN Inpatient Medications:  morphine injection, ondansetron **OR** ondansetron (ZOFRAN) IV  Review of Systems: Constitutional: Weight is stable.  Eyes: No changes in vision. ENT: No oral lesions, sore throat.  GI: see HPI.  Heme/Lymph: No easy bruising.  CV: No chest pain.  GU: No hematuria.  Integumentary: No rashes.  Neuro: No headaches.  Psych: No depression/anxiety.  Endocrine: No heat/cold intolerance.  Allergic/Immunologic: No urticaria.  Resp: No cough, SOB.  Musculoskeletal: No joint swelling.    Physical Examination: BP 133/56 mmHg  Pulse 111  Temp(Src) 97.7 F (36.5 C) (Oral)  Resp 19  Ht 5\' 11"  (1.803 m)  Wt 100.653 kg (221 lb 14.4 oz)  BMI 30.96 kg/m2  SpO2 94% Gen: NAD, alert and oriented x 4 HEENT: PEERLA, EOMI, Neck: supple, no JVD or thyromegaly Chest: CTA bilaterally, no wheezes, crackles, or other adventitious sounds CV: RRR, no m/g/c/r Abd: soft, NT right now, ND, +BS in all four quadrants; no HSM, guarding, ridigity, or rebound tenderness Ext: no edema, well perfused with 2+ pulses, Skin: no rash or lesions noted Lymph: no LAD  Data: Lab Results  Component Value Date   WBC 6.4 08/18/2014   HGB 13.2 08/18/2014   HCT 40.1 08/18/2014   MCV 93.4 08/18/2014   PLT 180 08/29/2014    Recent Labs Lab 08/17/2014 1143 08/17/14 0436 08/18/14 0717  HGB 15.8 14.8 13.2   Lab Results  Component Value Date   NA 139 08/21/2014   K 3.8 08/18/2014   CL 105 08/21/2014   CO2 26 08/18/2014   BUN 10 08/24/2014   CREATININE 0.67 08/27/2014   Lab Results  Component Value Date   ALT 75* 08/03/2014   AST 60* 08/08/2014   ALKPHOS 98 08/29/2014   BILITOT 5.8* 08/10/2014   No results for input(s): APTT, INR, PTT in the last 168 hours. Assessment/Plan: [name] is a 69 y.o. male with cholecystitis and probable choledocholithiasis. Pt on Abx.   Recommendations: Will need ERCP today. Discussed in detail with pt and wife about ERCP. Discussed potential complications, incl reaction to anesthesia, bleeding, pancreatitis, etc. Pt and wife agreed. Thanks.  Please call with questions or concerns.  Lometa Riggin, Ezzard StandingPAUL Y, MD

## 2014-08-19 NOTE — Progress Notes (Signed)
Patient ID: Alex Allison, male   DOB: 1945-10-22, 69 y.o.   MRN: [id number] Pt not breathing on his own after extubation. Thus, pt had to be reintubated with significant difficulty, due to anatomy and edema of vocal cords, by anesthesia. If pt to have GB surgery tomorrow, may be better to proceed with surgery while pt is intubated. Will try to contact Dr. Juliann PulseLundquist.

## 2014-08-19 NOTE — Progress Notes (Signed)
Upon transfer 

## 2014-08-19 NOTE — Transfer of Care (Signed)
Immediate Anesthesia Transfer of Care Note  Patient: Alex Allison  Procedure(s) Performed: Procedure(s): ENDOSCOPIC RETROGRADE CHOLANGIOPANCREATOGRAPHY (ERCP) (N/A)  Patient Location: PACU  Anesthesia Type:General  Level of Consciousness: sedated and confused  Airway & Oxygen Therapy: Patient remains intubated per anesthesia plan and Patient placed on Ventilator (see vital sign flow sheet for setting)  Post-op Assessment: Report given to RN, Post -op Vital signs reviewed and stable and Patient moving all extremities X 4  Post vital signs: Reviewed and stable  Last Vitals:  Filed Vitals:   08/10/14 1714  BP: 97/55  Pulse: 68  Temp: 36 C  Resp: 18    Complications: respiratory complications

## 2014-08-19 NOTE — Anesthesia Preprocedure Evaluation (Addendum)
Anesthesia Evaluation  Patient identified by MRN, date of birth, ID band Patient awake    Reviewed: Allergy & Precautions, NPO status , Patient's Chart, lab work & pertinent test results  History of Anesthesia Complications (+) PONV  Airway Mallampati: II  TM Distance: >3 FB Neck ROM: Limited   Comment: Could be difficult. Dental  (+) Teeth Intact   Pulmonary    Pulmonary exam normal       Cardiovascular Exercise Tolerance: Poor hypertension, Pt. 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 Affects legs more than arms.  Neuromuscular disease    GI/Hepatic Acute cholecystitis, elevated enzymes, CBD stone.   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   Abdominal (+)  Abdomen: soft.    Peds  Hematology   Anesthesia Other Findings   Reproductive/Obstetrics                            Anesthesia Physical Anesthesia Plan  ASA: III and emergent  Anesthesia Plan: General   Post-op Pain Management:    Induction: Intravenous  Airway Management Planned: Oral ETT  Additional Equipment:   Intra-op Plan:   Post-operative Plan: Extubation in OR  Informed Consent: I have reviewed the patients History and Physical, chart, labs and discussed the procedure including the risks, benefits and alternatives for the proposed anesthesia with the patient or authorized representative who has indicated his/her understanding and acceptance.     Plan Discussed with: CRNA  Anesthesia Plan Comments:         Anesthesia Quick Evaluation

## 2014-08-19 NOTE — Op Note (Signed)
ERCP showed numerous stones in a dilated CBD. Biliary sphincterotomy done with extractions of many stones. No further stones remaining. Clear liquid diet rest of today. Ok for [facility] [employer] surgery tomorrow. Thanks.

## 2014-08-19 NOTE — Consult Note (Signed)
[facility] [place] [facility]   PATIENT NAME: Alex Allison    MR#:  [id number]  DATE OF BIRTH:  [birth year]   REFERRING MD: DR. Philis NettleLundquest  REASON FOR REQUEST: Post ERCP Hypoxia  SUBJECTIVE:  Patient was admitted by surgical services on 08/25/2014 for acute cholecystitis. Patient underwent ERCP today. After his procedure he was brought to the PACU. He was showing signs of extubation so he was subsequently extubated. However shortly after patient developed respiratory distress. Anesthesia and nursing using Ambu bag to apply positive pressure, however his O2 saturations were in the 80s. He therefore was reintubated. Hospital service is consulted for medical management.  REVIEW OF SYSTEMS:  Unable to obtain patient is intubated on the vent   DRUG ALLERGIES:  No Known Allergies   Past medical history: Polio Hypertension, essential Depression   Family history: Unable to obtain as the patient is intubated and sedated  Social history: As per medical chart no tobacco alcohol or drug use  Surgical history: As per medical chart anal fissure repair  Meds: Vitamin D 2000 units daily Docusate 100 mg daily Gabapentin 100 mg 2 tablets twice a day Lisinopril 20 mg in the morning [place] 5 mg at bedtime and at bedtime Zyprexa 2.5 mg at bedtime Zoloft 50 mg daily Trazodone 50 mg at bedtime Zyprexa 5 mg twice a day  VITALS:  Blood pressure 97/55, pulse 68, temperature 96.8 F (36 C), temperature source Tympanic, resp. rate 18, height 5\' 11"  (1.803 m), weight 100.653 kg (221 lb 14.4 oz), SpO2 96 %.  PHYSICAL EXAMINATION:  GENERAL:  69 y.o.-year-old patient intubated on vent.  EYES: Pupils equal, round, reactive to light and accommodation. No scleral icterus. HEENT: Head atraumatic, normocephalic. Patient is intubated  NECK:  Supple, no jugular venous distention. No thyroid enlargement LUNGS: Wheezing bilaterally without rhonchi or rales.   CARDIOVASCULAR: S1, S2 normal. No murmurs, rubs, or gallops.  ABDOMEN: Soft, nontender, nondistended. Bowel sounds present. No organomegaly or mass.  EXTREMITIES: No pedal edema, cyanosis, or clubbing.  NEUROLOGIC: Patient is intubated and sedated  PSYCHIATRIC: Patient is intubated and sedated  SKIN: No obvious rash, lesion, or ulcer.    LABORATORY PANEL:   CBC  Recent Labs Lab 08/18/14 0717 08/02/2014 0511  WBC 6.4  --   HGB 13.2  --   HCT 40.1  --   PLT 185 180   ------------------------------------------------------------------------------------------------------------------  Chemistries   Recent Labs Lab 08/29/2014 0511  NA 139  K 3.8  CL 105  CO2 26  GLUCOSE 138*  BUN 10  CREATININE 0.67  CALCIUM 8.1*  AST 60*  ALT 75*  ALKPHOS 98  BILITOT 5.8*   ------------------------------------------------------------------------------------------------------------------  Cardiac Enzymes No results for input(s): TROPONINI in the last 168 hours. ------------------------------------------------------------------------------------------------------------------  RADIOLOGY:  No results found.  EKG: No EKG    ASSESSMENT AND PLAN:  This is a 69 year old male with past medical history of depression and polio who was admitted on May 15 for acute cholecystitis subsequently underwent ERCP on 08/24/2014 and now is intubated for hypoxic respiratory failure.  1. acute hypoxic Restoril failure: Patient was intubated and then extubated and then had to be reintubated for hypoxia. At this time I will use Lasix 40 IV every 12 hours. Chest x-ray is pending at this time. Patient also has some wheezing so I will go ahead and start some steroids. If chest x-ray is consistent with congestive heart failure we will need to obtain at [place]. We will continue  with assist control mode. ABG will be ordered in 30 minutes. Pulmonary consultation has been placed.  2. Hypotension: This may be  due to anesthesia or labetalol that was given for initial hypertension. Apparently after the procedure patient's blood pressures were systolic in the 190s and 200s. At that time he was agitated and hypoxic he was given 10 mg of IV labetalol and subsequently has been hypotensive. It is also noted the patient is hypothermic. He is currently on Levothroid drip to keep map greater than 65. I will continue to monitor blood pressure.   3. Hypothermia: I suspect this is related to the anesthesia. I can order blood cultures to evaluate for sepsis. He will continue with the bear hugger.  4. Acute cholecystitis: As per surgery we management. Patient should remain intubated for planned procedure for cholecystectomy.  5. Polio: This is a diagnosis that is written in the medical chart.   6.  History of hypertension: Patient is hypotensive obviously we're can hold outpatient medications.  7. Depression: Patient is now intubated we are holding all outpatient medications.   Management plans  discussed with Dr. Juliann PulseLundquist   CODE STATUS  FULL AS PER MEDICAL CHART  critical care TOTAL TIME TAKING CARE OF THIS PATIENT 50 minutes.   Thank you for allowing us to participate in the care of this patient. We will follow.   Contrina Orona M.D on 08/27/2014 at 5:28 PM  Between 7am to 6pm - Pager - [phone]  After 6pm go to [email or web address] - password [email or web address] [facility]  [facility]  Office  [phone]  CC: Primary care physician; Corky DownsMASOUD,JAVED, MD

## 2014-08-19 NOTE — Consult Note (Signed)
Patient is currently off the floor now for ERCP.  Will see patient for consult tomorrow.  I have reviewed the CT scan.  The L1 compression fracture can be treated with a lumbosacral corset while out of bed and pain management with medicine.  Recommend PT evaluation when patient cleared to get out of bed by GI.  Progression seen of T7 compression fracture.  Patient will need a spine specialist evaluation for this as an outpatient if he has pain at this level.  I am not a spine surgeon and have nothing further to offer this patient for this condition.  He may be a candidate for spinal fusion or kyphoplasty, but will need evaluation by a spine surgeon to determine this.

## 2014-08-19 NOTE — Progress Notes (Signed)
Several attempts made to pass ng tube unable to do called md to inform can hold for now and hold po meds for now Shara Blazingobb,Zaneta Lightcap M, [place]RN

## 2014-08-19 NOTE — Op Note (Signed)
[facility]enterology Patient Name: Alex Allison Procedure Date: 08/10/2014 2:40 PM MRN: [id number] Account #: [id number] Date of Birth: 04/19/1945 Admit Type: Inpatient Age: 69 [facility] Gender: Male Note Status: Finalized Procedure:         ERCP Indications:       Abdominal pain of suspected biliary origin, Bile duct                     stone on Computed Tomogram Scan, Abnormal liver function                     test Providers:         Ezzard StandingPaul Y. Bluford Kaufmannh, MD Referring MD:      Corky DownsJaved Masoud, MD (Referring MD) Medicines:         General Anesthesia Complications:     No immediate complications. Procedure:         Pre-Anesthesia Assessment:                    - Prior to the procedure, a History and Physical was                     performed, and patient medications, allergies and                     sensitivities were reviewed. The patient's tolerance of                     previous anesthesia was reviewed.                    - The risks and benefits of the procedure and the sedation                     options and risks were discussed with the patient. All                     questions were answered and informed consent was obtained.                    - After reviewing the risks and benefits, the patient was                     deemed in satisfactory condition to undergo the procedure.                    After obtaining informed consent, the scope was passed                     under direct vision. Throughout the procedure, the                     patient's blood pressure, pulse, and oxygen saturations                     were monitored continuously. The Olympus TJF-160VF                     duodenoscope (S#. [id number]) was introduced through the                     mouth, and used to inject contrast into and used to inject  contrast into the bile duct. The ERCP was accomplished                     without difficulty. The patient  tolerated the procedure                     well. Findings:      The scout film was normal. The esophagus was successfully intubated       under direct vision. The scope was advanced to a normal major papilla in       the descending duodenum without detailed examination of the pharynx,       larynx and associated structures, and upper GI tract. The upper GI tract       was grossly normal. The bile duct was deeply cannulated with the       short-nosed traction sphincterotome. Contrast was injected. I personally       interpreted the bile duct images. Ductal flow of contrast was adequate.       Image quality was adequate. Contrast extended to the entire biliary       tree. The main bile duct was moderately dilated, with a stone causing an       obstruction. The main bile duct contained filling defect(s) thought to       be stones. A straight Roadrunner wire was passed into the biliary tree.       Biliary sphincterotomy was made with a monofilament traction (standard)       sphincterotome using ERBE electrocautery. The sphincterotomy oozed       blood. The biliary tree was swept with a 12 mm balloon starting at the       bifurcation. Many stones were removed. No stones remained. Impression:        - The entire main bile duct was moderately dilated, with a                     stone causing an obstruction.                    - A filling defect consistent with a stone was seen on the                     cholangiogram.                    - A sphincterotomy was performed.                    - The biliary tree was swept.                    - Choledocholithiasis was found. Complete removal was                     accomplished by biliary sphincterotomy and balloon                     extraction.                    - No specimens collected. Recommendation:    - Observe patient's clinical course.                    - The findings and recommendations were discussed with the  patient's family. Procedure Code(s): --- Professional ---                    [phone], Endoscopic retrograde cholangiopancreatography                     (ERCP); with removal of calculi/debris from                     biliary/pancreatic duct(s)                    43262, Endoscopic retrograde cholangiopancreatography                     (ERCP); with sphincterotomy/papillotomy Diagnosis Code(s): --- Professional ---                    K80.51, Calculus of bile duct without cholangitis or                     cholecystitis with obstruction                    R10.9, Unspecified abdominal pain                    R94.5, Abnormal results of liver function studies                    R93.2, Abnormal findings on diagnostic imaging of liver                     and biliary tract CPT copyright 2014 American Medical Association. All rights reserved. The codes documented in this report are preliminary and upon coder review may  be revised to meet current compliance requirements. Wallace CullensPaul Y Zaineb Nowaczyk, MD 08/30/2014 3:55:49 PM This report has been signed electronically. Number of Addenda: 0 Note Initiated On: 08/23/2014 2:40 PM      [facility]

## 2014-08-19 NOTE — Progress Notes (Signed)
Pt transferred to CCU; pt motorized chair, leg braces, and personal belongings brought to CCU by RN and nurse tech; report given at bedside in CCU to [facility]

## 2014-08-20 ENCOUNTER — Inpatient Hospital Stay: Payer: Medicare HMO

## 2014-08-20 ENCOUNTER — Inpatient Hospital Stay
Admit: 2014-08-20 | Discharge: 2014-08-20 | Disposition: A | Discharge status: Home / Self Care | Payer: Medicare HMO | Attending: Surgery | Admitting: Surgery

## 2014-08-20 LAB — COMPREHENSIVE METABOLIC PANEL
ALT: 57 U/L (ref 17–63)
AST: 39 U/L (ref 15–41)
Albumin: 2.6 g/dL — ABNORMAL LOW (ref 3.5–5.0)
Alkaline Phosphatase: 88 U/L (ref 38–126)
Anion gap: 6 (ref 5–15)
BILIRUBIN TOTAL: 4.3 mg/dL — AB (ref 0.3–1.2)
BUN: 14 mg/dL (ref 6–20)
CALCIUM: 7.6 mg/dL — AB (ref 8.9–10.3)
CHLORIDE: 107 mmol/L (ref 101–111)
CO2: 26 mmol/L (ref 22–32)
CREATININE: 0.72 mg/dL (ref 0.61–1.24)
GLUCOSE: 204 mg/dL — AB (ref 65–99)
Potassium: 3.4 mmol/L — ABNORMAL LOW (ref 3.5–5.1)
Sodium: 139 mmol/L (ref 135–145)
Total Protein: 5.4 g/dL — ABNORMAL LOW (ref 6.5–8.1)

## 2014-08-20 LAB — CBC
HCT: 36.5 % — ABNORMAL LOW (ref 40.0–52.0)
Hemoglobin: 12 g/dL — ABNORMAL LOW (ref 13.0–18.0)
MCH: 31 pg (ref 26.0–34.0)
MCHC: 32.9 g/dL (ref 32.0–36.0)
MCV: 94.1 fL (ref 80.0–100.0)
PLATELETS: 169 10*3/uL (ref 150–440)
RBC: 3.88 MIL/uL — AB (ref 4.40–5.90)
RDW: 14.2 % (ref 11.5–14.5)
WBC: 4.3 10*3/uL (ref 3.8–10.6)

## 2014-08-20 LAB — PROTIME-INR
INR: 1.01
Prothrombin Time: 13.5 seconds (ref 11.4–15.0)

## 2014-08-20 LAB — BLOOD GAS, ARTERIAL
ALLENS TEST (PASS/FAIL): POSITIVE — AB
Acid-Base Excess: 2.6 mmol/L (ref 0.0–3.0)
BICARBONATE: 27.2 meq/L (ref 21.0–28.0)
FIO2: 0.4 %
O2 Saturation: 88 %
Patient temperature: 37
pCO2 arterial: 41 mmHg (ref 32.0–48.0)
pH, Arterial: 7.43 (ref 7.350–7.450)
pO2, Arterial: 53 mmHg — ABNORMAL LOW (ref 83.0–108.0)

## 2014-08-20 LAB — MAGNESIUM: Magnesium: 1.8 mg/dL (ref 1.7–2.4)

## 2014-08-20 LAB — LIPASE, BLOOD: Lipase: 32 U/L (ref 22–51)

## 2014-08-20 LAB — PHOSPHORUS: Phosphorus: 2.2 mg/dL — ABNORMAL LOW (ref 2.5–4.6)

## 2014-08-20 MED ORDER — FAMOTIDINE IN NACL 20-0.9 MG/50ML-% IV SOLN
20.0000 mg | Freq: Two times a day (BID) | INTRAVENOUS | Status: DC
  Administered 2014-08-20 – 2014-08-25 (×12): 20 mg via INTRAVENOUS
  Filled 2014-08-20 (×15): qty 50

## 2014-08-20 MED ORDER — SODIUM CHLORIDE 0.9 % IV SOLN
INTRAVENOUS | Status: DC
  Administered 2014-08-20: 14:00:00 via INTRAVENOUS
  Administered 2014-08-21: 100 mL/h via INTRAVENOUS

## 2014-08-20 MED ORDER — FENTANYL CITRATE (PF) 100 MCG/2ML IJ SOLN
INTRAMUSCULAR | Status: AC
  Administered 2014-08-20: 100 ug
  Filled 2014-08-20: qty 2

## 2014-08-20 MED ORDER — POTASSIUM CHLORIDE 10 MEQ/100ML IV SOLN
10.0000 meq | INTRAVENOUS | Status: AC
  Administered 2014-08-20 (×4): 10 meq via INTRAVENOUS
  Filled 2014-08-20 (×4): qty 100

## 2014-08-20 MED ORDER — FENTANYL 2500MCG IN NS 250ML (10MCG/ML) PREMIX INFUSION
40.0000 ug/h | INTRAVENOUS | Status: DC
  Administered 2014-08-20: 40 ug/h via INTRAVENOUS
  Administered 2014-08-21: 200 ug/h via INTRAVENOUS
  Filled 2014-08-20 (×2): qty 250

## 2014-08-20 MED ORDER — FENTANYL CITRATE (PF) 100 MCG/2ML IJ SOLN: 100.0000 ug | INTRAMUSCULAR | Status: DC | PRN

## 2014-08-20 MED ORDER — MAGNESIUM SULFATE 2 GM/50ML IV SOLN
2.0000 g | Freq: Once | INTRAVENOUS | Status: AC
  Administered 2014-08-20: 2 g via INTRAVENOUS
  Filled 2014-08-20: qty 50

## 2014-08-20 MED ORDER — POTASSIUM & SODIUM PHOSPHATES 280-160-250 MG PO PACK
1.0000 | PACK | Freq: Three times a day (TID) | ORAL | Status: AC
  Administered 2014-08-20 – 2014-08-22 (×5): 1 via ORAL
  Filled 2014-08-20 (×5): qty 1

## 2014-08-20 NOTE — Anesthesia Postprocedure Evaluation (Signed)
  Anesthesia Post-op Note  Patient: Alex Allison  Procedure(s) Performed: Procedure(s): ENDOSCOPIC RETROGRADE CHOLANGIOPANCREATOGRAPHY (ERCP) (N/A)  Anesthesia type:General  Patient location: ICU  Post pain: Pain level controlled  Post assessment: Post-op Vital signs reviewed, Patient's Cardiovascular Status Stable, Respiratory Function Stable, Patent Airway and No signs of Nausea or vomiting. Patient remains stable on ventilator, sedated at this time.  Post vital signs: Reviewed and stable  Last Vitals:  Filed Vitals:   08/20/14 0800  BP: 102/69  Pulse: 53  Temp:   Resp: 16    Level of consciousness: sedated  Complications: No apparent anesthesia complications

## 2014-08-20 NOTE — Care Management (Addendum)
Required intubation post ERCP for acute respiratory failure. Admitted to ICU.  Plans for percutaneous drain insertion.  REmains intubated on ventilator support

## 2014-08-20 NOTE — Progress Notes (Signed)
Initial Nutrition Assessment  DOCUMENTATION CODES:     INTERVENTION:  Coordination of Care: per MD Kasa/Lundquist agreeable to initiation of enteral nutrition s/p drain placement and OG tube placement as currently pt does not have access for enteral nutrition.  Will follow poc and make recommendations accordingly.  NUTRITION DIAGNOSIS:  Inadequate oral intake related to inability to eat as evidenced by NPO status.  GOAL:  Provide needs based on ASPEN/SCCM guidelines; recommend initiation on enteral nutrition as medically able within 24-48hours  MONITOR:   (Energy Intake, Electrolyte and Renal Profile, Pulmonary Profile, Anthropometrics, Digestive system)  REASON FOR ASSESSMENT:  Ventilator    ASSESSMENT:  Pt admitted with cholelithiasis with CBD stones and hyperbilirubinemia. Pt currently intubated s/p ERCP yesterday. Pt scheduled for percutaneous chole tube for drainage today per MD note. PMHx:  Past Medical History  Diagnosis Date  . Depressed   . Anxiety   . Polio   . Hypertension    Medications: Lasix, Remeron, Potassium and Sodium Phosphates, NS at 15400mL/hr, fentanyl, Propofol Labs: Electrolyte and Renal Profile:    Recent Labs Lab 08/18/14 0717 12/11/2014 0511 08/20/14 0527 08/20/14 1106  BUN 11 10 14   --   CREATININE 0.67 0.67 0.72  --   NA 139 139 139  --   K 3.7 3.8 3.4*  --   MG  --   --   --  1.8  PHOS  --   --   --  2.2*   Glucose Profile:   Recent Labs  12/11/2014 1841  GLUCAP 156*   Protein Profile:   Recent Labs Lab 08/18/14 0717 12/11/2014 0511 08/20/14 0527  ALBUMIN 3.4* 3.1* 2.6*   Height:  Ht Readings from Last 1 Encounters:  08/20/14 5\' 11"  (1.803 m)    Weight:  Wt Readings from Last 1 Encounters:  08/20/14 227 lb 8.2 oz (103.2 kg)    Ideal Body Weight:  83.6kg  Wt Readings from Last 10 Encounters:  08/20/14 227 lb 8.2 oz (103.2 kg)    Unable to complete Nutrition-Focused physical exam at this time.    BMI:   Body mass index is 31.75 kg/(m^2).  Estimated Nutritional Needs:  Kcal:  1135-1448kcals, (11-14kcals/kg) using acutal body weight of 103.2kg  Protein:  167-209g protein (2.0-2.5g/kg) using IBW of 83.6kg  Fluid:  2090-25408mL of fluid (25-930mL/kg) using IBW of 83.6kg  Skin:  Reviewed, no issues  Diet Order:  Diet NPO time specified Except for: Other (See Comments)  EDUCATION NEEDS:  No education needs identified at this time   Intake/Output Summary (Last 24 hours) at 08/20/14 1548 Last data filed at 08/20/14 1241  Gross per 24 hour  Intake   2950 ml  Output   2275 ml  Net    675 ml    Last BM:  5/14  HIGH Care Level  Leda QuailAllyson Azarel Banner, RD, LDN Pager [phone]

## 2014-08-20 NOTE — Progress Notes (Addendum)
   Pharmacy Consult for Electrolytes Indication:   No Known Allergies  Patient Measurements: Height: 5\' 11"  (180.3 cm) Weight: 227 lb 8.2 oz (103.2 kg) IBW/kg (Calculated) : 75.3 Adjusted Body Weight:  Usual Weight:   Vital Signs: Temp: 97.5 F (36.4 C) (05/19 0700) Temp Source: Oral (05/19 0700) BP: 102/69 mmHg (05/19 0800) Pulse Rate: 53 (05/19 0800) Intake/Output from previous day: 05/18 0701 - 05/19 0700 In: 3393.8 [I.V.:3193.8; IV Piggyback:200] Out: 1875 [Urine:1875] Intake/Output from this shift:    Labs:  Recent Labs  08/18/14 0717 08/10/2014 0511 08/20/14 0527  WBC 6.4  --  4.3  HGB 13.2  --  12.0*  HCT 40.1  --  36.5*  PLT 185 180 169     Recent Labs  08/18/14 0717 08/12/2014 0511 08/20/14 0527  NA 139 139 139  K 3.7 3.8 3.4*  CL 105 105 107  CO2 27 26 26   GLUCOSE 123* 138* 204*  BUN 11 10 14   CREATININE 0.67 0.67 0.72  CALCIUM 8.3* 8.1* 7.6*  PROT 6.3* 5.6* 5.4*  ALBUMIN 3.4* 3.1* 2.6*  AST 63* 60* 39  ALT 84* 75* 57  ALKPHOS 103 98 88  BILITOT 5.5* 5.8* 4.3*  BILIDIR 3.2*  --   --    Estimated Creatinine Clearance: 106.6 mL/min (by C-G formula based on Cr of 0.72).    Recent Labs  08/07/2014 1841  GLUCAP 156*    Medical History: Past Medical History  Diagnosis Date  . Depressed   . Anxiety   . Polio   . Hypertension     Medications:  Scheduled:  . ampicillin-sulbactam (UNASYN) IV  3 g Intravenous Q6H  . chlorhexidine  15 mL Mouth/Throat BID  . famotidine (PEPCID) IV  20 mg Intravenous Q12H  . furosemide  40 mg Intravenous Daily  . gabapentin  200 mg Oral BID  . mirtazapine  22.5 mg Oral QHS  . OLANZapine  2.5 mg Oral QHS  . pantoprazole (PROTONIX) IV  40 mg Intravenous Q12H  . potassium chloride  10 mEq Intravenous Q1 Hr x 4  . sertraline  50 mg Oral Daily  . traZODone  50 mg Oral QHS      Assessment: Pharmacy consulted to monitor and adjust electrolytes in this 69yo M admitted for acute cholecystitis s/p ERCP and  possible percutaneous drainage. Patient was intubated on 5/18 for procedure, extubated and then reintubated due to respiratory distress.   Plan:  1. Antibiotics - Unasyn 3gm IV Q6hrs Day 5 for acute cholecystitis. This is appropriate for renal function.   2. Electrolytes - K: 3.4 replaced with 40meq total KCL. Phos: 2.2, will give sodium/potassium phosphate packets TID x6 doses. Mg: 1.8, replaced with Mg 2gm IV once. All other labs WNL. Will recheck in AM.   3. Oral medications unable to be given due to lack of G tube. Nurse will attemp again to place. Nutrition on hold at this time. Home medications not restarted at this time include gabapentin 200mg  BID and olanzapine 5mg  BID.  4. Protonix changed to famotidine due to cost and association with asp PNA and Cdiff. Steroids d/c'd 5/19. Versed drip changed to fentanly and propofol with versed pushes PRN.   Pharmacy will continue to follow.  902 Peninsula CourtMatt Lake Cityarwile, VermontPharm D., BCPS 08/20/2014

## 2014-08-20 NOTE — Procedures (Signed)
Successful perc cholecystostomy No comp Stable Gs/cx sent Full report in pacs

## 2014-08-20 NOTE — Progress Notes (Signed)
RN made Dr. Clint GuyHower aware that [facility] and that it reads the tube is coiled in stomach. Dr. Clint GuyHower stated "it is fine to use, leave it where it is at."  Daira Hine B

## 2014-08-20 NOTE — Progress Notes (Signed)
Surgery Progress Note  S: No acute issues, still on 60% fio2 O: AF,VSS, good uop GEN: NAD/sedated ABD: soft, unable to assess tenderness  WBC 4.3  A/P 69 yo s/p ERCP for choledocholithiasis, probable cholecystitis - due to patients recent decompensation and probable cholecystitis, I feel that cholecystectomy will be difficult due to how long it has been going on and may require open surgery.  This, + general anesthesia needed for cholecystectomy may cause him to require long term intubation.  Will opt for perc chole tube at this time with interval cholecystectomy in 6 weeks post discharge.

## 2014-08-20 NOTE — Consult Note (Signed)
PULMONARY / CRITICAL CARE MEDICINE   Name: Alex Allison MRN: [id number] DOB: Apr 20, 1945    ADMISSION DATE:  08/09/2014 CONSULT    HISTORY OF PRESENT ILLNESS       69 yo white male admitted to ICU for post op resp failure, patient underwent ERCP for acute cholecystitis.  After his procedure he was brought to the PACU.  he was subsequently extubated. However shortly after, patient developed respiratory distress and was re-intubated for severe hypoxia.  Patient remains intubated,sedated. Family at bedside updated, fio2 at 60%, will plan for Perc drainage in GB with radiology as per surgery recs.. .           PAST MEDICAL HISTORY    :  Past Medical History  Diagnosis Date  . Depressed   . Anxiety   . Polio   . Hypertension    Past Surgical History  Procedure Laterality Date  . Anal fissure repair     Prior to Admission medications   Medication Sig Start Date End Date Taking? Authorizing Provider  Cholecalciferol 2000 UNITS CAPS Take 1 capsule by mouth daily.   Yes Historical Provider, MD  Docusate Sodium 100 MG capsule Take 100 mg by mouth daily.   Yes Historical Provider, MD  gabapentin (NEURONTIN) 100 MG capsule Take 200 mg by mouth 2 (two) times daily.   Yes Historical Provider, MD  lisinopril (PRINIVIL,ZESTRIL) 20 MG tablet Take 20 mg by mouth every morning.   Yes Historical Provider, MD  Melatonin 5 MG TABS Take 1 tablet by mouth at bedtime.   Yes Historical Provider, MD  mirtazapine (REMERON) 45 MG tablet Take 22.5 mg by mouth at bedtime.   Yes Historical Provider, MD  OLANZapine (ZYPREXA) 2.5 MG tablet Take 2.5 mg by mouth at bedtime.   Yes Historical Provider, MD  sertraline (ZOLOFT) 50 MG tablet Take 50 mg by mouth daily.   Yes Historical Provider, MD  traZODone (DESYREL) 50 MG tablet Take 50 mg by mouth at bedtime.   Yes Historical Provider, MD  OLANZapine (ZYPREXA) 5 MG tablet Take 5 mg by mouth 2 (two) times daily.    Historical Provider, MD   No Known  Allergies   FAMILY HISTORY   History reviewed. No pertinent family history.    SOCIAL HISTORY    reports that he has never smoked. He does not have any smokeless tobacco history on file. He reports that he does not drink alcohol or use illicit drugs.  Review of Systems  Unable to perform ROS: critical illness      VITAL SIGNS    Temp:  [96.8 F (36 C)-99.8 F (37.7 C)] 97.5 F (36.4 C) (05/19 0700) Pulse Rate:  [53-111] 53 (05/19 0800) Resp:  [14-22] 16 (05/19 0800) BP: (90-146)/(55-93) 102/69 mmHg (05/19 0800) SpO2:  [92 %-100 %] 95 % (05/19 0800) FiO2 (%):  [40 %-70 %] 60 % (05/19 0737) Weight:  [227 lb 8.2 oz (103.2 kg)] 227 lb 8.2 oz (103.2 kg) (05/19 0510) HEMODYNAMICS:   VENTILATOR SETTINGS: Vent Mode:  [-] PRVC FiO2 (%):  [40 %-70 %] 60 % Set Rate:  [15 bmp] 15 bmp Vt Set:  [500 mL] 500 mL PEEP:  [5 cmH20] 5 cmH20 INTAKE / OUTPUT:  Intake/Output Summary (Last 24 hours) at 08/20/14 1030 Last data filed at 08/20/14 0600  Gross per 24 hour  Intake   2850 ml  Output   1775 ml  Net   1075 ml       PHYSICAL EXAM  Physical Exam  Constitutional: He appears distressed.  HENT:  Head: Normocephalic and atraumatic.  Eyes: Pupils are equal, round, and reactive to light. No scleral icterus.  Neck: Normal range of motion. Neck supple.  Cardiovascular: Normal rate and regular rhythm.   No murmur heard. Pulmonary/Chest: No respiratory distress. He has no wheezes. He has rales.  resp distress  Abdominal: Soft. He exhibits distension. There is no tenderness.  Musculoskeletal: He exhibits no edema.  Neurological: He displays normal reflexes. Coordination normal.  gcs<8T  Skin: Skin is warm. No rash noted. He is diaphoretic.       LABS   LABS:  CBC  Recent Labs Lab 08/17/14 0436 08/18/14 0717 08/04/2014 0511 08/20/14 0527  WBC 9.5 6.4  --  4.3  HGB 14.8 13.2  --  12.0*  HCT 45.3 40.1  --  36.5*  PLT 216 185 180 169   Coag's No results for  input(s): APTT, INR in the last 168 hours. BMET  Recent Labs Lab 08/18/14 0717 08/24/2014 0511 08/20/14 0527  NA 139 139 139  K 3.7 3.8 3.4*  CL 105 105 107  CO2 27 26 26   BUN 11 10 14   CREATININE 0.67 0.67 0.72  GLUCOSE 123* 138* 204*   Electrolytes  Recent Labs Lab 08/18/14 0717 08/06/2014 0511 08/20/14 0527  CALCIUM 8.3* 8.1* 7.6*   Sepsis Markers No results for input(s): LATICACIDVEN, PROCALCITON, O2SATVEN in the last 168 hours. ABG  Recent Labs Lab 08/12/2014 1902 08/20/14 0558  PHART 7.30* 7.43  PCO2ART 49* 41  PO2ART 58* 53*   Liver Enzymes  Recent Labs Lab 08/18/14 0717 08/02/2014 0511 08/20/14 0527  AST 63* 60* 39  ALT 84* 75* 57  ALKPHOS 103 98 88  BILITOT 5.5* 5.8* 4.3*  ALBUMIN 3.4* 3.1* 2.6*   Cardiac Enzymes No results for input(s): TROPONINI, PROBNP in the last 168 hours. Glucose  Recent Labs Lab 08/15/2014 2151 08/26/2014 1841  GLUCAP 117* 156*     No results found for this or any previous visit (from the past 240 hour(s)).    IMAGING    Dg Chest Port 1 View  08/13/2014   CLINICAL DATA:  Status post intubation.  EXAM: PORTABLE CHEST - 1 VIEW  COMPARISON:  Aug 22, 2013.  FINDINGS: Stable cardiomediastinal silhouette. Severe hypoinflation of the lungs is noted with elevated left hemidiaphragm. Left basilar opacity is noted concerning for subsegmental atelectasis. No pneumothorax is noted. Minimal right basilar opacity is noted most consistent with subsegmental atelectasis. Endotracheal tube is in grossly good position with distal tip 4 cm above the carina.  IMPRESSION: Severe hypoinflation of the lungs is noted with elevated left hemidiaphragm. Left basilar subsegmental atelectasis is noted. Endotracheal tube is in grossly good position.   Electronically Signed   By: Marijo Conception, M.D.   On: 08/29/2014 17:39      Indwelling Urinary Catheter continued, requirement due to   Reason to continue Indwelling Urinary Catheter for strict  Intake/Output monitoring for hemodynamic instability                ASSESSMENT/PLAN    69 yo white male with post op hypoxic  resp failure likely  from acute hypoventilation with severe sepsis from acute cholecystitis  PULMONARY -Respiratory Failure -continue Full MV support -continue Bronchodilator Therapy -Wean Fio2 and PEEP as tolerated -will perform SAT/SBt when respiratory parameters are met   CARDIOVASCULAR -Needs ICU montioring -vasopressors as needed for septic shock  RENAL -watch OU -continue foley catheter  GASTROINTESTINAL S/p ERCP -await VIR to place percutaneous GB drain   HEMATOLOGIC -watch h/h  INFECTIOUS -on iv unasyn for cholecystitis  ENDOCRINE -watch FSBS  NEUROLOGIC -sedation as needed    I have personally obtained a history, examined the patient, evaluated laboratory and imaging results, formulated the assessment and plan and placed orders.  The Patient requires high complexity decision making for assessment and support, frequent evaluation and titration of therapies, application of advanced monitoring technologies and extensive interpretation of multiple databases. Critical Care Time devoted to patient care services described in this note is 55 minutes.   Overall, patient is critically ill, prognosis is guarded. Patient at high risk for cardiac arrest and death.   Corrin Parker, M.D. Pulmonary & [place] Director Intensive Care Unit   08/20/2014, 10:30 AM

## 2014-08-20 NOTE — Progress Notes (Signed)
[facility] PRO[facility]  Alex Allison ZOX:[id number] DOB: 12-Mar-1946 DOA: 08/06/2014 PCP: Corky DownsMASOUD,JAVED, MD  HPI/Subjective: Patient intubated and sedated. He was reintubated after ERCP yesterday.  Objective: Filed Vitals:   08/20/14 1100  BP: 94/66  Pulse: 51  Temp:   Resp: 15    Intake/Output Summary (Last 24 hours) at 08/20/14 1324 Last data filed at 08/20/14 1241  Gross per 24 hour  Intake   2950 ml  Output   1875 ml  Net   1075 ml   Filed Weights   08/18/14 0058 08/18/14 0500 08/20/14 0510  Weight: 99.746 kg (219 lb 14.4 oz) 100.653 kg (221 lb 14.4 oz) 103.2 kg (227 lb 8.2 oz)    ROS: ROS unable to perform secondary to being intubated and sedated. Exam: Physical Exam  HENT:  Nose: No mucosal edema.  Eyes: Conjunctivae and lids are normal. Pupils are equal, round, and reactive to light.  Neck: No JVD present. Carotid bruit is not present. No edema present. No thyroid mass and no thyromegaly present.  Cardiovascular: S1 normal and S2 normal.  Bradycardia present.  Exam reveals no gallop.   No murmur heard. Pulses:      Dorsalis pedis pulses are 1+ on the right side, and 1+ on the left side.  Respiratory: No respiratory distress. He has decreased breath sounds in the right lower field and the left lower field. He has no wheezes. He has rhonchi in the right lower field and the left lower field. He has no rales.  GI: Soft. Bowel sounds are normal. There is no hepatosplenomegaly. There is no tenderness.  Musculoskeletal:       Right ankle: He exhibits swelling.       Left ankle: He exhibits swelling.  Lymphadenopathy:    He has no cervical adenopathy.  Neurological:  Patient is sedated. Unable to test neurological status at this point.  Skin: Skin is warm. No rash noted. Nails show no clubbing.  Psychiatric:  Patient is sedated and unable to assess at this point.    Data Reviewed: Basic Metabolic Panel:  Recent Labs Lab 08/13/2014 1143  08/17/14 0436 08/18/14 0717 Mar 26, 2015 0511 08/20/14 0527 08/20/14 1106  NA 140 140 139 139 139  --   K 4.2 3.8 3.7 3.8 3.4*  --   CL 104 105 105 105 107  --   CO2 27 28 27 26 26   --   GLUCOSE 135* 137* 123* 138* 204*  --   BUN 13 14 11 10 14   --   CREATININE 0.87 0.82 0.67 0.67 0.72  --   CALCIUM 9.1 8.7* 8.3* 8.1* 7.6*  --   MG  --   --   --   --   --  1.8  PHOS  --   --   --   --   --  2.2*   Liver Function Tests:  Recent Labs Lab 08/07/2014 1143 08/17/14 0436 08/18/14 0717 Mar 26, 2015 0511 08/20/14 0527  AST 28 212* 63* 60* 39  ALT 27 154* 84* 75* 57  ALKPHOS 64 119 103 98 88  BILITOT 0.7 3.6* 5.5* 5.8* 4.3*  PROT 7.5 6.6 6.3* 5.6* 5.4*  ALBUMIN 4.5 3.8 3.4* 3.1* 2.6*    Recent Labs Lab 08/20/14 0526  LIPASE 32   CBC:  Recent Labs Lab 08/21/2014 1143 08/17/14 0436 08/18/14 0717 Mar 26, 2015 0511 08/20/14 0527  WBC 12.9* 9.5 6.4  --  4.3  NEUTROABS 10.6*  --   --   --   --  HGB 15.8 14.8 13.2  --  12.0*  HCT 48.1 45.3 40.1  --  36.5*  MCV 93.3 93.8 93.4  --  94.1  PLT 252 216 185 180 169     Recent Results (from the past 240 hour(s))  Culture, blood (routine x 2)     Status: None (Preliminary result)   Collection Time: 08/18/2014  6:50 PM  Result Value Ref Range Status   Specimen Description BLOOD  Final   Special Requests NONE  Final   Culture NO GROWTH < 24 HOURS  Final   Report Status PENDING  Incomplete  Culture, blood (routine x 2)     Status: None (Preliminary result)   Collection Time: 08/21/2014  7:00 PM  Result Value Ref Range Status   Specimen Description BLOOD  Final   Special Requests NONE  Final   Culture NO GROWTH < 24 HOURS  Final   Report Status PENDING  Incomplete     Studies: Dg Chest Port 1 View  08/20/2014   CLINICAL DATA:  Respiratory arrest.  EXAM: PORTABLE CHEST - 1 VIEW  COMPARISON:  08/06/2014.  FINDINGS: Support apparatus: Endotracheal tube is present. The tip is 4.3 cm from the carina. Monitoring leads project over the chest.   Cardiomediastinal Silhouette: Partially obscured by airspace disease and markedly low lung volumes. No change from prior. Aortic arch atherosclerosis.  Lungs: Basilar atelectasis and airspace disease, probably representing some pulmonary edema along with atelectasis. No pneumothorax.  Effusions:  Probable small RIGHT pleural effusion.  Other:  None.  IMPRESSION: 1. Endotracheal tube 4.3 cm from the carina. 2. Low volume chest with probable expiratory phase imaging. 3. Basilar opacity likely representing airspace disease and atelectasis in combination.   Electronically Signed   By: Andreas NewportGeoffrey  Lamke M.D.   On: 08/20/2014 09:32   Dg Chest Port 1 View  08/17/2014   CLINICAL DATA:  Status post intubation.  EXAM: PORTABLE CHEST - 1 VIEW  COMPARISON:  Aug 22, 2013.  FINDINGS: Stable cardiomediastinal silhouette. Severe hypoinflation of the lungs is noted with elevated left hemidiaphragm. Left basilar opacity is noted concerning for subsegmental atelectasis. No pneumothorax is noted. Minimal right basilar opacity is noted most consistent with subsegmental atelectasis. Endotracheal tube is in grossly good position with distal tip 4 cm above the carina.  IMPRESSION: Severe hypoinflation of the lungs is noted with elevated left hemidiaphragm. Left basilar subsegmental atelectasis is noted. Endotracheal tube is in grossly good position.   Electronically Signed   By: Lupita RaiderJames  Green Jr, M.D.   On: 08/31/2014 17:39    Scheduled Meds: . ampicillin-sulbactam (UNASYN) IV  3 g Intravenous Q6H  . chlorhexidine  15 mL Mouth/Throat BID  . famotidine (PEPCID) IV  20 mg Intravenous Q12H  . furosemide  40 mg Intravenous Daily  . gabapentin  200 mg Oral BID  . mirtazapine  22.5 mg Oral QHS  . OLANZapine  2.5 mg Oral QHS  . potassium chloride  10 mEq Intravenous Q1 Hr x 4  . sertraline  50 mg Oral Daily  . traZODone  50 mg Oral QHS   Continuous Infusions: . dextrose 5% lactated ringers 125 mL/hr at 08/20/14 0600  . fentaNYL  infusion INTRAVENOUS 40 mcg/hr (08/20/14 1144)  . norepinephrine (LEVOPHED) Adult infusion Stopped (08/02/2014 2154)  . propofol (DIPRIVAN) infusion 20 mcg/kg/min (08/20/14 1144)    Assessment/Plan:  1. Acute respiratory failure with hypoxia; patient was reintubated after ERCP yesterday. Continues to require ventilator support. FiO2 is 50%. Appreciate pulmonary consultation.  Propofol and fentanyl for sedation. Of note the patient does have a history of polio at age 57 and he was in an iron lung treatment for 18 months. Patient may have an underlying restrictive pattern of breathing. Hopefully tomorrow can start undergoing weaning trials. 2. Acute cholecystitis with Choledocholelithiasis; patient is status post ERCP with sphincterotomy and stones extracted. Today the plan is for a interventional radiology drainage of the gallbladder. Surgical team following. IV Unasyn. 3. Elevated liver function tests; this is secondary to the acute cholecystitis and choledochollithiasis. LFTs trending a little bit better, bilirubin still elevated. 4. Polio, left sided weakness.  Unable to ambulate at this point. Has always used a brace for his left leg.   Code Status:     Code Status Orders        Start     Ordered   08/18/2014 1430  Full code   Continuous     08/07/2014 1433    Advance Directive Documentation        Most Recent Value   Type of Advance Directive  Healthcare Power of Attorney, Living will   Pre-existing out of facility DNR order (yellow form or pink MOST form)     "MOST" Form in Place?       Family Communication: Patient's wife at the bedside. Disposition Plan: To be determined  Time spent: 32 minutes critical care time  Alford Highland  [facility]

## 2014-08-21 LAB — COMPREHENSIVE METABOLIC PANEL
ALK PHOS: 92 U/L (ref 38–126)
ALT: 55 U/L (ref 17–63)
ANION GAP: 7 (ref 5–15)
AST: 32 U/L (ref 15–41)
Albumin: 2.9 g/dL — ABNORMAL LOW (ref 3.5–5.0)
BUN: 20 mg/dL (ref 6–20)
CALCIUM: 7.8 mg/dL — AB (ref 8.9–10.3)
CO2: 27 mmol/L (ref 22–32)
Chloride: 107 mmol/L (ref 101–111)
Creatinine, Ser: 0.97 mg/dL (ref 0.61–1.24)
GLUCOSE: 124 mg/dL — AB (ref 65–99)
Potassium: 3.7 mmol/L (ref 3.5–5.1)
SODIUM: 141 mmol/L (ref 135–145)
TOTAL PROTEIN: 5.9 g/dL — AB (ref 6.5–8.1)
Total Bilirubin: 2.4 mg/dL — ABNORMAL HIGH (ref 0.3–1.2)

## 2014-08-21 LAB — PHOSPHORUS: Phosphorus: 3.9 mg/dL (ref 2.5–4.6)

## 2014-08-21 LAB — MAGNESIUM: Magnesium: 2.3 mg/dL (ref 1.7–2.4)

## 2014-08-21 MED ORDER — LORAZEPAM 2 MG/ML IJ SOLN
2.0000 mg | INTRAMUSCULAR | Status: DC | PRN
  Administered 2014-08-22: 2 mg via INTRAVENOUS
  Filled 2014-08-21 (×3): qty 1

## 2014-08-21 MED ORDER — DEXAMETHASONE SODIUM PHOSPHATE 10 MG/ML IJ SOLN
10.0000 mg | Freq: Once | INTRAMUSCULAR | Status: AC
  Administered 2014-08-21: 10 mg via INTRAVENOUS

## 2014-08-21 MED ORDER — DEXAMETHASONE SODIUM PHOSPHATE 10 MG/ML IJ SOLN
INTRAMUSCULAR | Status: AC
  Administered 2014-08-21: 10 mg via INTRAVENOUS
  Filled 2014-08-21: qty 1

## 2014-08-21 MED ORDER — IPRATROPIUM-ALBUTEROL 0.5-2.5 (3) MG/3ML IN SOLN
3.0000 mL | RESPIRATORY_TRACT | Status: DC
  Administered 2014-08-21 – 2014-08-28 (×42): 3 mL via RESPIRATORY_TRACT
  Filled 2014-08-21 (×45): qty 3

## 2014-08-21 MED ORDER — BUDESONIDE 0.5 MG/2ML IN SUSP
0.5000 mg | Freq: Two times a day (BID) | RESPIRATORY_TRACT | Status: DC
  Administered 2014-08-21 – 2014-08-28 (×14): 0.5 mg via RESPIRATORY_TRACT
  Filled 2014-08-21 (×14): qty 2

## 2014-08-21 MED ORDER — LORAZEPAM 2 MG/ML IJ SOLN
2.0000 mg | Freq: Once | INTRAMUSCULAR | Status: AC
Start: 2014-08-21 — End: 2014-08-22
  Administered 2014-08-21 – 2014-08-22 (×2): 2 mg via INTRAVENOUS
  Filled 2014-08-21: qty 1

## 2014-08-21 MED ORDER — GABAPENTIN 100 MG PO CAPS: 200.0000 mg | ORAL_CAPSULE | Freq: Two times a day (BID) | ORAL | Status: DC

## 2014-08-21 NOTE — Consult Note (Signed)
PULMONARY / CRITICAL CARE MEDICINE   Name: Alex Allison MRN: [id number] DOB: February 13, 1946    ADMISSION DATE:  08/27/2014 CONSULT    HISTORY OF PRESENT ILLNESS   Remains int     Remains intubated,sedated, fio2 down to 40%, s/p perc tube placed in GB Family at bedside, will attempt SAT/SBT today     PAST MEDICAL HISTORY    :  Past Medical History  Diagnosis Date  . Depressed   . Anxiety   . Polio   . Hypertension    Past Surgical History  Procedure Laterality Date  . Anal fissure repair     Prior to Admission medications   Medication Sig Start Date End Date Taking? Authorizing Provider  Cholecalciferol 2000 UNITS CAPS Take 1 capsule by mouth daily.   Yes Historical Provider, MD  Docusate Sodium 100 MG capsule Take 100 mg by mouth daily.   Yes Historical Provider, MD  gabapentin (NEURONTIN) 100 MG capsule Take 200 mg by mouth 2 (two) times daily.   Yes Historical Provider, MD  lisinopril (PRINIVIL,ZESTRIL) 20 MG tablet Take 20 mg by mouth every morning.   Yes Historical Provider, MD  Melatonin 5 MG TABS Take 1 tablet by mouth at bedtime.   Yes Historical Provider, MD  mirtazapine (REMERON) 45 MG tablet Take 22.5 mg by mouth at bedtime.   Yes Historical Provider, MD  OLANZapine (ZYPREXA) 2.5 MG tablet Take 2.5 mg by mouth at bedtime.   Yes Historical Provider, MD  sertraline (ZOLOFT) 50 MG tablet Take 50 mg by mouth daily.   Yes Historical Provider, MD  traZODone (DESYREL) 50 MG tablet Take 50 mg by mouth at bedtime.   Yes Historical Provider, MD  OLANZapine (ZYPREXA) 5 MG tablet Take 5 mg by mouth 2 (two) times daily.    Historical Provider, MD   No Known Allergies   FAMILY HISTORY   History reviewed. No pertinent family history.    SOCIAL HISTORY    reports that he has never smoked. He does not have any smokeless tobacco history on file. He reports that he does not drink alcohol or use illicit drugs.  Review of Systems  Unable to perform ROS: critical  illness      VITAL SIGNS    Temp:  [97.7 F (36.5 C)-98.2 F (36.8 C)] 98 F (36.7 C) (05/20 0800) Pulse Rate:  [43-64] 53 (05/20 0600) Resp:  [14-17] 15 (05/20 0600) BP: (66-119)/(47-80) 100/62 mmHg (05/20 0600) SpO2:  [90 %-98 %] 98 % (05/20 0700) FiO2 (%):  [40 %-60 %] 40 % (05/20 0803) Weight:  [227 lb 8.2 oz (103.2 kg)-228 lb 9.9 oz (103.7 kg)] 228 lb 9.9 oz (103.7 kg) (05/20 0500) HEMODYNAMICS:   VENTILATOR SETTINGS: Vent Mode:  [-] PRVC FiO2 (%):  [40 %-60 %] 40 % Set Rate:  [15 bmp] 15 bmp Vt Set:  [500 mL] 500 mL PEEP:  [5 cmH20] 5 cmH20 INTAKE / OUTPUT:  Intake/Output Summary (Last 24 hours) at 08/21/14 0924 Last data filed at 08/21/14 0800  Gross per 24 hour  Intake 2065.5 ml  Output   1193 ml  Net  872.5 ml       PHYSICAL EXAM   Physical Exam  Constitutional: No distress.  HENT:  Head: Normocephalic and atraumatic.  Eyes: Pupils are equal, round, and reactive to light. No scleral icterus.  Neck: Normal range of motion. Neck supple.  Cardiovascular: Normal rate and regular rhythm.   No murmur heard. Pulmonary/Chest: No respiratory distress. He has  no wheezes. He has no rales.  resp distress  Abdominal: Soft. He exhibits distension. There is no tenderness.  t-tube in place  Musculoskeletal: He exhibits no edema.  Neurological: He displays normal reflexes. Coordination normal.  gcs<8T  Skin: Skin is warm. No rash noted. He is not diaphoretic.       LABS   LABS:  CBC  Recent Labs Lab 08/17/14 0436 08/18/14 0717 08/13/2014 0511 08/20/14 0527  WBC 9.5 6.4  --  4.3  HGB 14.8 13.2  --  12.0*  HCT 45.3 40.1  --  36.5*  PLT 216 185 180 169   Coag's  Recent Labs Lab 08/20/14 1106  INR 1.01   BMET  Recent Labs Lab 08/17/2014 0511 08/20/14 0527 08/21/14 0641  NA 139 139 141  K 3.8 3.4* 3.7  CL 105 107 107  CO2 26 26 27   BUN 10 14 20   CREATININE 0.67 0.72 0.97  GLUCOSE 138* 204* 124*   Electrolytes  Recent Labs Lab  08/26/2014 0511 08/20/14 0527 08/20/14 1106 08/21/14 0641  CALCIUM 8.1* 7.6*  --  7.8*  MG  --   --  1.8 2.3  PHOS  --   --  2.2* 3.9   Sepsis Markers No results for input(s): LATICACIDVEN, PROCALCITON, O2SATVEN in the last 168 hours. ABG  Recent Labs Lab 08/22/2014 1902 08/20/14 0558  PHART 7.30* 7.43  PCO2ART 49* 41  PO2ART 58* 53*   Liver Enzymes  Recent Labs Lab 08/09/2014 0511 08/20/14 0527 08/21/14 0641  AST 60* 39 32  ALT 75* 57 55  ALKPHOS 98 88 92  BILITOT 5.8* 4.3* 2.4*  ALBUMIN 3.1* 2.6* 2.9*   Cardiac Enzymes No results for input(s): TROPONINI, PROBNP in the last 168 hours. Glucose  Recent Labs Lab 05-21-2014 2151 08/05/2014 1841  GLUCAP 117* 156*     Recent Results (from the past 240 hour(s))  Culture, blood (routine x 2)     Status: None (Preliminary result)   Collection Time: 09/01/2014  6:50 PM  Result Value Ref Range Status   Specimen Description BLOOD  Final   Special Requests NONE  Final   Culture NO GROWTH 2 DAYS  Final   Report Status PENDING  Incomplete  Culture, blood (routine x 2)     Status: None (Preliminary result)   Collection Time: 08/02/2014  7:00 PM  Result Value Ref Range Status   Specimen Description BLOOD  Final   Special Requests NONE  Final   Culture NO GROWTH 2 DAYS  Final   Report Status PENDING  Incomplete      IMAGING    [place] Intraoperative  08/20/2014   CLINICAL DATA:    Ultrasound was provided for use by the ordering physician, and a technical  charge was applied by the performing facility.  No radiologist  interpretation/professional services rendered.    Ir Perc Cholecystostomy  08/20/2014   CLINICAL DATA:  Acute calculus cholecystitis, currently not and operative candidate  EXAM: ULTRASOUND FLUOROSCOPIC PERCUTANEOUS CHOLECYSTOSTOMY  Date:  5/19/20165/19/2016 3:57 pm  Radiologist:  M. Ruel Favorsrevor Shick, MD  Guidance:  Ultrasound and fluoroscopic  FLUOROSCOPY TIME:  30 second  MEDICATIONS AND MEDICAL HISTORY: Patient is  receiving IV propofol and intubated. Additional sedation not required. 1% lidocaine locally. Patient is already on IV antibiotics.  ANESTHESIA/SEDATION: None.  CONTRAST:  None.  COMPLICATIONS: None immediate  PROCEDURE: Informed consent was obtained from the patient following explanation of the procedure, risks, benefits and alternatives. The patient understands, agrees and consents  for the procedure. All questions were addressed. A time out was performed.  Maximal barrier sterile technique utilized including caps, mask, sterile gowns, sterile gloves, large sterile drape, hand hygiene, and ChloraPrep.  Previous imaging reviewed. Patient positioned supine. Preliminary ultrasound performed. Gallbladder was localized and correlated with the prior CT. Under sterile conditions and local anesthesia, percutaneous needle access was performed under direct ultrasound guidance. Needle position confirmed within the gallbladder. There was return of bile. A guidewire was advanced followed by the Accustick dilator set. Amplatz guidewire inserted. Ten [place] tract dilatation performed. [place] multipurpose drain advanced. Retention loop formed in the gallbladder. Syringe aspiration yielded exudative bile. Sample sent for Gram stain and culture. Catheter secured with a Prolene suture. No immediate complication. Patient tolerated the procedure well.  IMPRESSION: Successful ultrasound fluoroscopic percutaneous cholecystostomy.   Electronically Signed   By: Judie Petit.  Shick M.D.   On: 08/20/2014 16:21   Dg Chest Port 1 View  08/20/2014   CLINICAL DATA:  Respiratory arrest.  EXAM: PORTABLE CHEST - 1 VIEW  COMPARISON:  08/10/2014.  FINDINGS: Support apparatus: Endotracheal tube is present. The tip is 4.3 cm from the carina. Monitoring leads project over the chest.  Cardiomediastinal Silhouette: Partially obscured by airspace disease and markedly low lung volumes. No change from prior. Aortic arch atherosclerosis.  Lungs: Basilar  atelectasis and airspace disease, probably representing some pulmonary edema along with atelectasis. No pneumothorax.  Effusions:  Probable small RIGHT pleural effusion.  Other:  None.  IMPRESSION: 1. Endotracheal tube 4.3 cm from the carina. 2. Low volume chest with probable expiratory phase imaging. 3. Basilar opacity likely representing airspace disease and atelectasis in combination.   Electronically Signed   By: Andreas Newport M.D.   On: 08/20/2014 09:32   Dg Abd Portable 1v  08/20/2014   CLINICAL DATA:  NG-tube placement  EXAM: PORTABLE ABDOMEN - 1 VIEW  COMPARISON:  Portable exam 1852 hours compared to CT of 08/12/2014  FINDINGS: Nasogastric tube coiled in proximal stomach.  Pigtail cholecystostomy tube RIGHT upper quadrant.  Few air-filled minimally prominent loops of small bowel in the LEFT mid abdomen.  Small amount retained contrast in colon.  Marked osseous demineralization.  Pelvic soft tissue calcifications unchanged.  IMPRESSION: Nasogastric tube coiled in proximal stomach.  Cholecystostomy tube RIGHT upper quadrant.   Electronically Signed   By: Ulyses Southward M.D.   On: 08/20/2014 20:46      Indwelling Urinary Catheter continued, requirement due to   Reason to continue Indwelling Urinary Catheter for strict Intake/Output monitoring for hemodynamic instability                ASSESSMENT/PLAN    69 yo white male with post op hypoxic  resp failure likely  from acute hypoventilation with severe sepsis from acute cholecystitis  PULMONARY-plan for SAT/SBT today -Respiratory Failure -continue Full MV support -continue Bronchodilator Therapy -Wean Fio2 and PEEP as tolerated   CARDIOVASCULAR -Needs ICU montioring -vasopressors as needed for septic shock  RENAL -watch UO -continue foley catheter  GASTROINTESTINAL S/p ERCP S/p  percutaneous GB drain   HEMATOLOGIC -watch h/h  INFECTIOUS -on iv unasyn for cholecystitis  ENDOCRINE -watch FSBS  NEUROLOGIC -wean  sedation to off this AM    I have personally obtained a history, examined the patient, evaluated laboratory and imaging results, formulated the assessment and plan and placed orders.  The Patient requires high complexity decision making for assessment and support, frequent evaluation and titration of therapies, application of advanced monitoring technologies and  extensive interpretation of multiple databases. Critical Care Time devoted to patient care services described in this note is 40 minutes.   Overall, patient is critically ill, prognosis is guarded. Patient at high risk for cardiac arrest and death.   Lucie Leather, M.D. Pulmonary & Critical Care Medicine [facility] Medical Director Intensive Care Unit   08/21/2014, 9:24 AM

## 2014-08-21 NOTE — Consult Note (Signed)
ORTHOPAEDIC CONSULTATION  REQUESTING PHYSICIAN: Lattie Hawichard E Cooper, MD  Chief Complaint: Pain  HPI: Alex Allison is a 69 y.o. male who was seen and evaluated in the ICU. He is status post ERCP. Patient is now extubated. He is seen at the bedside with his wife present. She has a history of polio which is severely affected his left side. He has decreased motor and sensory function in the left lower extremity. He has worn a brace on this lower extremity for years. More recently he has had a brace for the right lower extremity after his knee began hyperextending. Patient uses a wheelchair at baseline and does not stand or ambulate.  Patient has a history of chronic back pain.  He currently has back pain.  He had a recent CT scan for workup of his abdomen which demonstrated a T7 fracture which had worsened compared to a prior study. He was also noted to have a new L1 compression fracture since his previous study. Patient denies any bowel or bladder dysfunction or change in his motor or sensory function.  Past Medical History  Diagnosis Date  . Depressed   . Anxiety   . Polio   . Hypertension    Past Surgical History  Procedure Laterality Date  . Anal fissure repair     History   Social History  . Marital Status: Married    Spouse Name: N/A  . Number of Children: N/A  . Years of Education: N/A   Social History Main Topics  . Smoking status: Never Smoker   . Smokeless tobacco: Not on file  . Alcohol Use: No  . Drug Use: No  . Sexual Activity: Not on file   Other Topics Concern  . None   Social History Narrative   History reviewed. No pertinent family history. No Known Allergies Prior to Admission medications   Medication Sig Start Date End Date Taking? Authorizing Provider  Cholecalciferol 2000 UNITS CAPS Take 1 capsule by mouth daily.   Yes Historical Provider, MD  Docusate Sodium 100 MG capsule Take 100 mg by mouth daily.   Yes Historical Provider, MD  gabapentin  (NEURONTIN) 100 MG capsule Take 200 mg by mouth 2 (two) times daily.   Yes Historical Provider, MD  lisinopril (PRINIVIL,ZESTRIL) 20 MG tablet Take 20 mg by mouth every morning.   Yes Historical Provider, MD  Melatonin 5 MG TABS Take 1 tablet by mouth at bedtime.   Yes Historical Provider, MD  mirtazapine (REMERON) 45 MG tablet Take 22.5 mg by mouth at bedtime.   Yes Historical Provider, MD  OLANZapine (ZYPREXA) 2.5 MG tablet Take 2.5 mg by mouth at bedtime.   Yes Historical Provider, MD  sertraline (ZOLOFT) 50 MG tablet Take 50 mg by mouth daily.   Yes Historical Provider, MD  traZODone (DESYREL) 50 MG tablet Take 50 mg by mouth at bedtime.   Yes Historical Provider, MD  OLANZapine (ZYPREXA) 5 MG tablet Take 5 mg by mouth 2 (two) times daily.    Historical Provider, MD   [place] Intraoperative  08/20/2014   CLINICAL DATA:    Ultrasound was provided for use by the ordering physician, and a technical  charge was applied by the performing facility.  No radiologist  interpretation/professional services rendered.    Ir Perc Cholecystostomy  08/20/2014   CLINICAL DATA:  Acute calculus cholecystitis, currently not and operative candidate  EXAM: ULTRASOUND FLUOROSCOPIC PERCUTANEOUS CHOLECYSTOSTOMY  Date:  5/19/20165/19/2016 3:57 pm  Radiologist:  M. Ruel Favorsrevor Shick, MD  Guidance:  Ultrasound and fluoroscopic  FLUOROSCOPY TIME:  30 second  MEDICATIONS AND MEDICAL HISTORY: Patient is receiving IV propofol and intubated. Additional sedation not required. 1% lidocaine locally. Patient is already on IV antibiotics.  ANESTHESIA/SEDATION: None.  CONTRAST:  None.  COMPLICATIONS: None immediate  PROCEDURE: Informed consent was obtained from the patient following explanation of the procedure, risks, benefits and alternatives. The patient understands, agrees and consents for the procedure. All questions were addressed. A time out was performed.  Maximal barrier sterile technique utilized including caps, mask, sterile gowns,  sterile gloves, large sterile drape, hand hygiene, and ChloraPrep.  Previous imaging reviewed. Patient positioned supine. Preliminary ultrasound performed. Gallbladder was localized and correlated with the prior CT. Under sterile conditions and local anesthesia, percutaneous needle access was performed under direct ultrasound guidance. Needle position confirmed within the gallbladder. There was return of bile. A guidewire was advanced followed by the Accustick dilator set. Amplatz guidewire inserted. Ten [place] tract dilatation performed. Ten [place] multipurpose drain advanced. Retention loop formed in the gallbladder. Syringe aspiration yielded exudative bile. Sample sent for Gram stain and culture. Catheter secured with a Prolene suture. No immediate complication. Patient tolerated the procedure well.  IMPRESSION: Successful ultrasound fluoroscopic percutaneous cholecystostomy.   Electronically Signed   By: Judie PetitM.  Shick M.D.   On: 08/20/2014 16:21   Dg Chest Port 1 View  08/20/2014   CLINICAL DATA:  Respiratory arrest.  EXAM: PORTABLE CHEST - 1 VIEW  COMPARISON:  07-30-2014.  FINDINGS: Support apparatus: Endotracheal tube is present. The tip is 4.3 cm from the carina. Monitoring leads project over the chest.  Cardiomediastinal Silhouette: Partially obscured by airspace disease and markedly low lung volumes. No change from prior. Aortic arch atherosclerosis.  Lungs: Basilar atelectasis and airspace disease, probably representing some pulmonary edema along with atelectasis. No pneumothorax.  Effusions:  Probable small RIGHT pleural effusion.  Other:  None.  IMPRESSION: 1. Endotracheal tube 4.3 cm from the carina. 2. Low volume chest with probable expiratory phase imaging. 3. Basilar opacity likely representing airspace disease and atelectasis in combination.   Electronically Signed   By: Andreas NewportGeoffrey  Lamke M.D.   On: 08/20/2014 09:32   Dg Chest Port 1 View  08-14-14   CLINICAL DATA:  Status post intubation.  EXAM:  PORTABLE CHEST - 1 VIEW  COMPARISON:  Aug 22, 2013.  FINDINGS: Stable cardiomediastinal silhouette. Severe hypoinflation of the lungs is noted with elevated left hemidiaphragm. Left basilar opacity is noted concerning for subsegmental atelectasis. No pneumothorax is noted. Minimal right basilar opacity is noted most consistent with subsegmental atelectasis. Endotracheal tube is in grossly good position with distal tip 4 cm above the carina.  IMPRESSION: Severe hypoinflation of the lungs is noted with elevated left hemidiaphragm. Left basilar subsegmental atelectasis is noted. Endotracheal tube is in grossly good position.   Electronically Signed   By: Lupita RaiderJames  Green Jr, M.D.   On: 07-30-2014 17:39   Dg Abd Portable 1v  08/20/2014   CLINICAL DATA:  NG-tube placement  EXAM: PORTABLE ABDOMEN - 1 VIEW  COMPARISON:  Portable exam 1852 hours compared to CT of 08/09/2014  FINDINGS: Nasogastric tube coiled in proximal stomach.  Pigtail cholecystostomy tube RIGHT upper quadrant.  Few air-filled minimally prominent loops of small bowel in the LEFT mid abdomen.  Small amount retained contrast in colon.  Marked osseous demineralization.  Pelvic soft tissue calcifications unchanged.  IMPRESSION: Nasogastric tube coiled in proximal stomach.  Cholecystostomy tube RIGHT upper quadrant.   Electronically Signed  By: Ulyses Southward M.D.   On: 08/20/2014 20:46    Positive ROS: All other systems have been reviewed and were otherwise negative with the exception of those mentioned in the HPI and as above.  Physical Exam: General: Alert, no acute distress Skin: No lesions in the area of chief complaint Neurologic: Sensation intact distally.   MUSCULOSKELETAL: Patient is lying supine in the ICU bed. He is drowsy but arousable. Patient has intact sensation to light touch in both lower extremities. He can weakly dorsiflex and plantarflex flex his ankle.  He has palpable pedal pulses. There is no larger me edema. 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 Patient has no motor function in the left ankle or toes.   Assessment: Chronic T7 compression fracture with increased loss of height. L1 compression fracture involving the superior endplate.  Plan: She may benefit from a lumbosacral corset for the L1 compression fracture. He has no new neurologic deficits. He has increased loss of height of the T7 compression fracture compared to prior study. He may want to consider evaluation by spine specialist as an outpatient. Otherwise for now his pain should be managed symptomatically. Patient may benefit from physical therapy evaluation.    Juanell Fairly, MD    08/21/2014 12:15 PM

## 2014-08-21 NOTE — Progress Notes (Signed)
Nutrition Follow-up  DOCUMENTATION CODES:     INTERVENTION:     NUTRITION DIAGNOSIS:  Inadequate oral intake related to inability to eat as evidenced by NPO status, ongoing.    GOAL:  Other (Comment) (Goal would be for diet to be progressed within 24-48 hr)    MONITOR:   (Energy Intake, Electrolyte and Renal Profile, Pulmonary Profile, Anthropometrics, Digestive system)  REASON FOR ASSESSMENT:   (follow-up)    ASSESSMENT:  Pt extubated this am. NG tube removed and planning to start ice chips this am. Pt s/p perc drain and ERCP  Electrolyte and Renal Profile:    Recent Labs Lab Aug 26, 2014 0511 08/20/14 0527 08/20/14 1106 08/21/14 0641  BUN 10 14  --  20  CREATININE 0.67 0.72  --  0.97  NA 139 139  --  141  K 3.8 3.4*  --  3.7  MG  --   --  1.8 2.3  PHOS  --   --  2.2* 3.9   Glucose Profile:   Recent Labs  Aug 26, 2014 1841  GLUCAP 156*   Medications: reviewed  Height:  Ht Readings from Last 1 Encounters:  08/20/14 5\' 11"  (1.803 m)    Weight:  Wt Readings from Last 1 Encounters:  08/21/14 228 lb 9.9 oz (103.7 kg)    Ideal Body Weight:     Wt Readings from Last 10 Encounters:  08/21/14 228 lb 9.9 oz (103.7 kg)    BMI:  Body mass index is 31.9 kg/(m^2).  Estimated Nutritional Needs:  Kcal:  [id number] kcas/d. BEE 1567 kcals (IF1.0-1.2, AF 1.2) Using IBW of 78kg  Protein:  94-117 gm/d (1.2-1.5 g/d)  Fluid:  1950-237340ml/d (25-3330ml/kg) Using IBW of 78kg  Skin:  Reviewed, no issues  Diet Order:  Diet NPO time specified Except for: Other (See Comments)  EDUCATION NEEDS:  No education needs identified at this time   Intake/Output Summary (Last 24 hours) at 08/21/14 1258 Last data filed at 08/21/14 1109  Gross per 24 hour  Intake 2519.6 ml  Output    793 ml  Net 1726.6 ml    Last BM:  5/14  HIGH Care Level Alante Weimann B. Freida BusmanAllen, RD, LDN [phone] (pager)

## 2014-08-21 NOTE — Progress Notes (Signed)
[facility] PROGRESS NOTE  Alex Allison ZOX:[id number] DOB: 08-26-1945 DOA: 08/11/2014 PCP: Corky DownsMASOUD,JAVED, MD  HPI/Subjective: Patient feeling cold right now. Shaking. Does not complain of shortness of breath. No cough. Some abdominal discomfort.  Objective: Filed Vitals:   08/21/14 0800  BP:   Pulse:   Temp: 98 F (36.7 C)  Resp:     Intake/Output Summary (Last 24 hours) at 08/21/14 1252 Last data filed at 08/21/14 1109  Gross per 24 hour  Intake 2519.6 ml  Output    793 ml  Net 1726.6 ml   Filed Weights   08/20/14 0510 08/20/14 1537 08/21/14 0500  Weight: 103.2 kg (227 lb 8.2 oz) 103.2 kg (227 lb 8.2 oz) 103.7 kg (228 lb 9.9 oz)    ROS: Review of Systems  Constitutional: Negative for fever and chills.  Eyes: Negative for blurred vision.  Respiratory: Negative for cough and shortness of breath.   Cardiovascular: Negative for chest pain.  Gastrointestinal: Positive for abdominal pain. Negative for nausea, vomiting, diarrhea and constipation.  Genitourinary: Negative for dysuria.  Musculoskeletal: Negative for joint pain.  Neurological: Negative for dizziness and headaches.    Exam: Physical Exam  HENT:  Nose: No mucosal edema.  Eyes: Conjunctivae and lids are normal. Pupils are equal, round, and reactive to light.  Neck: No JVD present. Carotid bruit is not present. No edema present. No thyroid mass and no thyromegaly present.  Cardiovascular: Regular rhythm, S1 normal and S2 normal.  Exam reveals no gallop.   No murmur heard. Pulses:      Dorsalis pedis pulses are 1+ on the right side, and 1+ on the left side.  Respiratory: No respiratory distress. He has decreased breath sounds in the right lower field and the left lower field. He has no wheezes. He has rhonchi in the right lower field. He has no rales.  GI: Soft. Bowel sounds are normal. There is no hepatosplenomegaly. There is no tenderness.  Musculoskeletal:       Right ankle: He exhibits  swelling.       Left ankle: He exhibits swelling.  Lymphadenopathy:    He has no cervical adenopathy.  Neurological: He is alert.  Skin: Skin is warm. No rash noted. Nails show no clubbing.  Psychiatric: He has a normal mood and affect.    Data Reviewed: Basic Metabolic Panel:  Recent Labs Lab 08/17/14 0436 08/18/14 0717 2014/07/22 0511 08/20/14 0527 08/20/14 1106 08/21/14 0641  NA 140 139 139 139  --  141  K 3.8 3.7 3.8 3.4*  --  3.7  CL 105 105 105 107  --  107  CO2 28 27 26 26   --  27  GLUCOSE 137* 123* 138* 204*  --  124*  BUN 14 11 10 14   --  20  CREATININE 0.82 0.67 0.67 0.72  --  0.97  CALCIUM 8.7* 8.3* 8.1* 7.6*  --  7.8*  MG  --   --   --   --  1.8 2.3  PHOS  --   --   --   --  2.2* 3.9   Liver Function Tests:  Recent Labs Lab 08/17/14 0436 08/18/14 0717 2014/07/22 0511 08/20/14 0527 08/21/14 0641  AST 212* 63* 60* 39 32  ALT 154* 84* 75* 57 55  ALKPHOS 119 103 98 88 92  BILITOT 3.6* 5.5* 5.8* 4.3* 2.4*  PROT 6.6 6.3* 5.6* 5.4* 5.9*  ALBUMIN 3.8 3.4* 3.1* 2.6* 2.9*    Recent Labs Lab 08/20/14  0526  LIPASE 32   CBC:  Recent Labs Lab 08/17/2014 1143 08/17/14 0436 08/18/14 0717 08/31/2014 0511 08/20/14 0527  WBC 12.9* 9.5 6.4  --  4.3  NEUTROABS 10.6*  --   --   --   --   HGB 15.8 14.8 13.2  --  12.0*  HCT 48.1 45.3 40.1  --  36.5*  MCV 93.3 93.8 93.4  --  94.1  PLT 252 216 185 180 169     Recent Results (from the past 240 hour(s))  Culture, blood (routine x 2)     Status: None (Preliminary result)   Collection Time: 08/18/2014  6:50 PM  Result Value Ref Range Status   Specimen Description BLOOD  Final   Special Requests NONE  Final   Culture NO GROWTH 2 DAYS  Final   Report Status PENDING  Incomplete  Culture, blood (routine x 2)     Status: None (Preliminary result)   Collection Time: 08/25/2014  7:00 PM  Result Value Ref Range Status   Specimen Description BLOOD  Final   Special Requests NONE  Final   Culture NO GROWTH 2 DAYS  Final    Report Status PENDING  Incomplete  Body fluid culture     Status: None (Preliminary result)   Collection Time: 08/20/14 11:54 AM  Result Value Ref Range Status   Specimen Description BILE  Final   Special Requests NONE  Final   Gram Stain PENDING  Incomplete   Culture NO GROWTH <24 HRS  Final   Report Status PENDING  Incomplete     Studies:   Scheduled Meds: . ampicillin-sulbactam (UNASYN) IV  3 g Intravenous Q6H  . chlorhexidine  15 mL Mouth/Throat BID  . famotidine (PEPCID) IV  20 mg Intravenous Q12H  . furosemide  40 mg Intravenous Daily  . gabapentin  200 mg Oral BID  . gabapentin  200 mg Oral BID  . mirtazapine  22.5 mg Oral QHS  . OLANZapine  2.5 mg Oral QHS  . potassium & sodium phosphates  1 packet Oral TID  . sertraline  50 mg Oral Daily  . traZODone  50 mg Oral QHS    Assessment/Plan:  1. Acute respiratory failure with hypoxia; patient was reintubated after ERCP. Extubated on 08/21/2014. Currently on 4.5 L of oxygen nasal cannula. Pulse ox wave is not that good and pulse ox is very variable. Continue to monitor overnight in the ICU. We will give incentive spirometry. On IV Lasix daily. 2. Acute cholecystitis with Choledocholelithiasis; patient is status post ERCP with sphincterotomy and stones extracted. Patient is also status post interventional radiology drainage of gallbladder. Patient will likely go home with the gallbladder drain. Surgical team following. IV Unasyn. Surgery to consider outpatient cholecystectomy. 3. Elevated liver function tests; this is secondary to the acute cholecystitis and choledochollithiasis. Bilirubin trending better today. 4. Polio, left sided weakness.  Unable to ambulate at this point. Has always used a brace for his left leg.   Code Status:     Code Status Orders        Start     Ordered   08/04/2014 1430  Full code   Continuous     08/20/2014 1433    Advance Directive Documentation        Most Recent Value   Type of Advance  Directive  Healthcare Power of Attorney, Living will   Pre-existing out of facility DNR order (yellow form or pink MOST form)     "MOST"  Form in Place?       Family Communication: Patient's wife at the bedside. Disposition Plan: To be determined  Time spent: 25 minutes  Alford HighlandWIETING, Nikoletta Varma  [facility]

## 2014-08-21 NOTE — Progress Notes (Signed)
Patient is easily agitated with stimulation from turning and oral care. Resists movement of upper extremities. Sinus brady per cardiac monitor. Blood pressure stable on 1mcg of levophed. o2 sats upper 90's on 40% fio2. No display of pain. Decreased UOP of 250cc. Wife visited around 2000 last night.  Alex Allison B

## 2014-08-21 NOTE — Progress Notes (Signed)
Patient agitated with repositioning and oral care. Patient trying to climb over right side of bed. Fentanyl and propofol increased at this time. RN at bedside and monitoring patient closely.  Alex Allison

## 2014-08-21 NOTE — Progress Notes (Signed)
PHARMACY - CRITICAL CARE PROGRESS NOTE  Pharmacy Consult for Electrolyte Management   Indication: ICU Status   No Known Allergies  Patient Measurements: Height: 5\' 11"  (180.3 cm) Weight: 228 lb 9.9 oz (103.7 kg) IBW/kg (Calculated) : 75.3   Vital Signs: Temp: 98 F (36.7 C) (05/20 0800) Temp Source: Oral (05/20 0800) BP: 100/62 mmHg (05/20 0600) Pulse Rate: 53 (05/20 0600) Intake/Output from previous day: 05/19 0701 - 05/20 0700 In: 1865.5 [I.V.:1145.5; NG/GT:120; IV Piggyback:600] Out: 1343 [Urine:1300; Emesis/NG output:25; Drains:18] Intake/Output from this shift: Total I/O In: 754.1 [P.O.:90; I.V.:664.1] Out: -  Vent settings for last 24 hours: Vent Mode:  [-] PRVC FiO2 (%):  [40 %] 40 % Set Rate:  [15 bmp] 15 bmp Vt Set:  [500 mL] 500 mL PEEP:  [5 cmH20] 5 cmH20  Labs:  Recent Labs  07-14-2014 0511 08/20/14 0527 08/20/14 1106 08/21/14 0641  WBC  --  4.3  --   --   HGB  --  12.0*  --   --   HCT  --  36.5*  --   --   PLT 180 169  --   --   INR  --   --  1.01  --   CREATININE 0.67 0.72  --  0.97  MG  --   --  1.8 2.3  PHOS  --   --  2.2* 3.9  ALBUMIN 3.1* 2.6*  --  2.9*  PROT 5.6* 5.4*  --  5.9*  AST 60* 39  --  32  ALT 75* 57  --  55  ALKPHOS 98 88  --  92  BILITOT 5.8* 4.3*  --  2.4*   Estimated Creatinine Clearance: 88.1 mL/min (by C-G formula based on Cr of 0.97).   Recent Labs  07-14-2014 1841  GLUCAP 156*    Microbiology: Recent Results (from the past 720 hour(s))  Culture, blood (routine x 2)     Status: None (Preliminary result)   Collection Time: 07-14-2014  6:50 PM  Result Value Ref Range Status   Specimen Description BLOOD  Final   Special Requests NONE  Final   Culture NO GROWTH 2 DAYS  Final   Report Status PENDING  Incomplete  Culture, blood (routine x 2)     Status: None (Preliminary result)   Collection Time: 07-14-2014  7:00 PM  Result Value Ref Range Status   Specimen Description BLOOD  Final   Special Requests NONE  Final   Culture NO GROWTH 2 DAYS  Final   Report Status PENDING  Incomplete  Body fluid culture     Status: None (Preliminary result)   Collection Time: 08/20/14 11:54 AM  Result Value Ref Range Status   Specimen Description BILE  Final   Special Requests NONE  Final   Gram Stain PENDING  Incomplete   Culture NO GROWTH <24 HRS  Final   Report Status PENDING  Incomplete    Medications:  Scheduled:  . ampicillin-sulbactam (UNASYN) IV  3 g Intravenous Q6H  . budesonide (PULMICORT) nebulizer solution  0.5 mg Nebulization BID  . chlorhexidine  15 mL Mouth/Throat BID  . famotidine (PEPCID) IV  20 mg Intravenous Q12H  . furosemide  40 mg Intravenous Daily  . gabapentin  200 mg Oral BID  . gabapentin  200 mg Oral BID  . ipratropium-albuterol  3 mL Nebulization Q4H  . mirtazapine  22.5 mg Oral QHS  . OLANZapine  2.5 mg Oral QHS  . potassium & sodium phosphates  1 packet Oral TID  . sertraline  50 mg Oral Daily  . traZODone  50 mg Oral QHS   Infusions:     Assessment: Pharmacy consulted to monitor and adjust electrolytes in this 69yo M admitted for acute cholecystitis s/p ERCP and possible percutaneous drainage. Patient was extubated on 5/20.   Plan:  1. Antibiotics - Unasyn 3gm IV Q6hrs Day 6 for acute cholecystitis. This is appropriate for renal function.   2. Electrolytes - Replaced on 5/19, WNL limits today. Will recheck electrolytes with am labs.  Pharmacy will continue to monitor and adjust per consult.    Simpson,Michael L 08/21/2014,1:49 PM

## 2014-08-21 NOTE — Consult Note (Signed)
Subjective: Patient seen for cholecystitis/choledocholithiasis/cholelithiasis. Events post ERCP noted. Patient has now been extubated earlier today and seems to be doing well. He has no nausea or vomiting. He continues with her epigastric and right upper quadrant pain. There is a cholecystostomy tube in place  Objective: Vital signs in last 24 hours: Temp:  [97.7 F (36.5 C)-98.2 F (36.8 C)] 98 F (36.7 C) (05/20 0800) Pulse Rate:  [46-64] 53 (05/20 0600) Resp:  [14-17] 15 (05/20 0600) BP: (66-109)/(47-80) 100/62 mmHg (05/20 0600) SpO2:  [90 %-98 %] 98 % (05/20 0700) FiO2 (%):  [40 %] 40 % (05/20 0803) Weight:  [103.7 kg (228 lb 9.9 oz)] 103.7 kg (228 lb 9.9 oz) (05/20 0500) Blood pressure 100/62, pulse 53, temperature 98 F (36.7 C), temperature source Oral, resp. rate 15, height 5\' 11"  (1.803 m), weight 103.7 kg (228 lb 9.9 oz), SpO2 98 %.   Intake/Output from previous day: 05/19 0701 - 05/20 0700 In: 1865.5 [I.V.:1145.5; NG/GT:120; IV Piggyback:600] Out: 1343 [Urine:1300; Emesis/NG output:25; Drains:18]  Intake/Output this shift: Total I/O In: 754.1 [P.O.:90; I.V.:664.1] Out: 1000 [Urine:1000]   General appearance:  69 year old male no acute distress overall appears comfortable. Resp:  Clear to auscultation Cardio:  Regular rate and rhythm GI:  Mild distention bowel sounds are positive. He is tender to palpation in the epigastric region and right upper quadrant. Extremities:  No clubbing cyanosis or edema   Lab Results: Results for orders placed or performed during the hospital encounter of 12/30/2014 (from the past 24 hour(s))  Magnesium     Status: None   Collection Time: 08/21/14  6:41 AM  Result Value Ref Range   Magnesium 2.3 1.7 - 2.4 mg/dL  Phosphorus     Status: None   Collection Time: 08/21/14  6:41 AM  Result Value Ref Range   Phosphorus 3.9 2.5 - 4.6 mg/dL  Comprehensive metabolic panel     Status: Abnormal   Collection Time: 08/21/14  6:41 AM  Result  Value Ref Range   Sodium 141 135 - 145 mmol/L   Potassium 3.7 3.5 - 5.1 mmol/L   Chloride 107 101 - 111 mmol/L   CO2 27 22 - 32 mmol/L   Glucose, Bld 124 (H) 65 - 99 mg/dL   BUN 20 6 - 20 mg/dL   Creatinine, Ser 1.610.97 0.61 - 1.24 mg/dL   Calcium 7.8 (L) 8.9 - 10.3 mg/dL   Total Protein 5.9 (L) 6.5 - 8.1 g/dL   Albumin 2.9 (L) 3.5 - 5.0 g/dL   AST 32 15 - 41 U/L   ALT 55 17 - 63 U/L   Alkaline Phosphatase 92 38 - 126 U/L   Total Bilirubin 2.4 (H) 0.3 - 1.2 mg/dL   GFR calc non Af Amer >60 >60 mL/min   GFR calc Af Amer >60 >60 mL/min   Anion gap 7 5 - 15      Recent Labs  08/22/2014 0511 08/20/14 0527  WBC  --  4.3  HGB  --  12.0*  HCT  --  36.5*  PLT 180 169   BMET  Recent Labs  08/06/2014 0511 08/20/14 0527 08/21/14 0641  NA 139 139 141  K 3.8 3.4* 3.7  CL 105 107 107  CO2 26 26 27   GLUCOSE 138* 204* 124*  BUN 10 14 20   CREATININE 0.67 0.72 0.97  CALCIUM 8.1* 7.6* 7.8*   LFT  Recent Labs  08/21/14 0641  PROT 5.9*  ALBUMIN 2.9*  AST 32  ALT 55  ALKPHOS 92  BILITOT 2.4*   PT/INR  Recent Labs  08/20/14 1106  LABPROT 13.5  INR 1.01   Hepatitis Panel No results for input(s): HEPBSAG, HCVAB, HEPAIGM, HEPBIGM in the last 72 hours. C-Diff No results for input(s): CDIFFTOX in the last 72 hours. No results for input(s): CDIFFPCR in the last 72 hours.   Studies/Results: [place] Intraoperative  08/20/2014   CLINICAL DATA:    Ultrasound was provided for use by the ordering physician, and a technical  charge was applied by the performing facility.  No radiologist  interpretation/professional services rendered.    Ir Perc Cholecystostomy  08/20/2014   CLINICAL DATA:  Acute calculus cholecystitis, currently not and operative candidate  EXAM: ULTRASOUND FLUOROSCOPIC PERCUTANEOUS CHOLECYSTOSTOMY  Date:  5/19/20165/19/2016 3:57 pm  Radiologist:  M. Ruel Favors, MD  Guidance:  Ultrasound and fluoroscopic  FLUOROSCOPY TIME:  30 second  MEDICATIONS AND MEDICAL HISTORY:  Patient is receiving IV propofol and intubated. Additional sedation not required. 1% lidocaine locally. Patient is already on IV antibiotics.  ANESTHESIA/SEDATION: None.  CONTRAST:  None.  COMPLICATIONS: None immediate  PROCEDURE: Informed consent was obtained from the patient following explanation of the procedure, risks, benefits and alternatives. The patient understands, agrees and consents for the procedure. All questions were addressed. A time out was performed.  Maximal barrier sterile technique utilized including caps, mask, sterile gowns, sterile gloves, large sterile drape, hand hygiene, and ChloraPrep.  Previous imaging reviewed. Patient positioned supine. Preliminary ultrasound performed. Gallbladder was localized and correlated with the prior CT. Under sterile conditions and local anesthesia, percutaneous needle access was performed under direct ultrasound guidance. Needle position confirmed within the gallbladder. There was return of bile. A guidewire was advanced followed by the Accustick dilator set. Amplatz guidewire inserted. [place] tract dilatation performed. [place] multipurpose drain advanced. Retention loop formed in the gallbladder. Syringe aspiration yielded exudative bile. Sample sent for Gram stain and culture. Catheter secured with a Prolene suture. No immediate complication. Patient tolerated the procedure well.  IMPRESSION: Successful ultrasound fluoroscopic percutaneous cholecystostomy.   Electronically Signed   By: Judie Petit.  Shick M.D.   On: 08/20/2014 16:21   Dg Chest Port 1 View  08/20/2014   CLINICAL DATA:  Respiratory arrest.  EXAM: PORTABLE CHEST - 1 VIEW  COMPARISON:  08/03/2014.  FINDINGS: Support apparatus: Endotracheal tube is present. The tip is 4.3 cm from the carina. Monitoring leads project over the chest.  Cardiomediastinal Silhouette: Partially obscured by airspace disease and markedly low lung volumes. No change from prior. Aortic arch atherosclerosis.  Lungs:  Basilar atelectasis and airspace disease, probably representing some pulmonary edema along with atelectasis. No pneumothorax.  Effusions:  Probable small RIGHT pleural effusion.  Other:  None.  IMPRESSION: 1. Endotracheal tube 4.3 cm from the carina. 2. Low volume chest with probable expiratory phase imaging. 3. Basilar opacity likely representing airspace disease and atelectasis in combination.   Electronically Signed   By: Andreas Newport M.D.   On: 08/20/2014 09:32   Dg Chest Port 1 View  09/01/2014   CLINICAL DATA:  Status post intubation.  EXAM: PORTABLE CHEST - 1 VIEW  COMPARISON:  Aug 22, 2013.  FINDINGS: Stable cardiomediastinal silhouette. Severe hypoinflation of the lungs is noted with elevated left hemidiaphragm. Left basilar opacity is noted concerning for subsegmental atelectasis. No pneumothorax is noted. Minimal right basilar opacity is noted most consistent with subsegmental atelectasis. Endotracheal tube is in grossly good position with distal tip 4 cm above the carina.  IMPRESSION: Severe  hypoinflation of the lungs is noted with elevated left hemidiaphragm. Left basilar subsegmental atelectasis is noted. Endotracheal tube is in grossly good position.   Electronically Signed   By: Lupita RaiderJames  Green Jr, M.D.   On: 07/02/2014 17:39   Dg Abd Portable 1v  08/20/2014   CLINICAL DATA:  NG-tube placement  EXAM: PORTABLE ABDOMEN - 1 VIEW  COMPARISON:  Portable exam 1852 hours compared to CT of 08/13/2014  FINDINGS: Nasogastric tube coiled in proximal stomach.  Pigtail cholecystostomy tube RIGHT upper quadrant.  Few air-filled minimally prominent loops of small bowel in the LEFT mid abdomen.  Small amount retained contrast in colon.  Marked osseous demineralization.  Pelvic soft tissue calcifications unchanged.  IMPRESSION: Nasogastric tube coiled in proximal stomach.  Cholecystostomy tube RIGHT upper quadrant.   Electronically Signed   By: Ulyses SouthwardMark  Boles M.D.   On: 08/20/2014 20:46    Scheduled Inpatient  Medications:   . ampicillin-sulbactam (UNASYN) IV  3 g Intravenous Q6H  . budesonide (PULMICORT) nebulizer solution  0.5 mg Nebulization BID  . chlorhexidine  15 mL Mouth/Throat BID  . famotidine (PEPCID) IV  20 mg Intravenous Q12H  . furosemide  40 mg Intravenous Daily  . gabapentin  200 mg Oral BID  . ipratropium-albuterol  3 mL Nebulization Q4H  . mirtazapine  22.5 mg Oral QHS  . OLANZapine  2.5 mg Oral QHS  . potassium & sodium phosphates  1 packet Oral TID  . sertraline  50 mg Oral Daily  . traZODone  50 mg Oral QHS    Continuous Inpatient Infusions:     PRN Inpatient Medications:  LORazepam, morphine injection, ondansetron **OR** ondansetron (ZOFRAN) IV  Miscellaneous:   Assessment:  1. Cholecystitis/choledocholithiasis/cholelithiasis. Patient now status post ERCP and cholecystostomy tube. Hence noted per surgery to do cholecystectomy in about 6 weeks. He is doing well from his episode of respiratory failure after his procedure.  Plan:  1. As noted above 2. Will sign off. Dr. Mechele CollinElliott is on call over the weekend if needed.  Christena DeemMartin U Amira Podolak MD 08/21/2014, 4:49 PM

## 2014-08-21 NOTE — Progress Notes (Signed)
Surgery Progress Note  S: Extubated.  S/p perc cholecystostomy tube yesterday O: AF/VSS, good uop GEN: NAD/A&Ox3 ABD: soft, mild tender, nondistended  Labs: Bili 2.4  A/P 69 yo admit for ? Cholecystitis, choledocholithiasis.  S/p ERCP and perc cholecystostomy.  Will plan on interval cholecystectomy in 6 weeks, decision made to hold on cholecystectomy given patients decompensation following ERCP.

## 2014-08-22 ENCOUNTER — Inpatient Hospital Stay: Payer: Medicare HMO

## 2014-08-22 ENCOUNTER — Other Ambulatory Visit: Payer: Self-pay | Admitting: *Deleted

## 2014-08-22 LAB — BLOOD GAS, ARTERIAL
ACID-BASE EXCESS: 3.2 mmol/L — AB (ref 0.0–3.0)
ACID-BASE EXCESS: 3.3 mmol/L — AB (ref 0.0–3.0)
Allens test (pass/fail): POSITIVE — AB
Allens test (pass/fail): POSITIVE — AB
Bicarbonate: 26.9 mEq/L (ref 21.0–28.0)
Bicarbonate: 27.8 mEq/L (ref 21.0–28.0)
Drawn by: 187461
FIO2: 0.36 %
FIO2: 0.8 %
O2 SAT: 89.3 %
O2 Saturation: 94.2 %
PH ART: 7.47 — AB (ref 7.350–7.450)
PO2 ART: 53 mmHg — AB (ref 83.0–108.0)
Patient temperature: 37
Patient temperature: 37
pCO2 arterial: 37 mmHg (ref 32.0–48.0)
pCO2 arterial: 41 mmHg (ref 32.0–48.0)
pH, Arterial: 7.44 (ref 7.350–7.450)
pO2, Arterial: 69 mmHg — ABNORMAL LOW (ref 83.0–108.0)

## 2014-08-22 LAB — BASIC METABOLIC PANEL
Anion gap: 12 (ref 5–15)
BUN: 23 mg/dL — AB (ref 6–20)
CALCIUM: 8 mg/dL — AB (ref 8.9–10.3)
CHLORIDE: 105 mmol/L (ref 101–111)
CO2: 26 mmol/L (ref 22–32)
Creatinine, Ser: 0.99 mg/dL (ref 0.61–1.24)
GFR calc Af Amer: >60 mL/min (ref >60)
GFR calc non Af Amer: >60 mL/min (ref >60)
GLUCOSE: 82 mg/dL (ref 65–99)
Potassium: 3.1 mmol/L — ABNORMAL LOW (ref 3.5–5.1)
Sodium: 143 mmol/L (ref 135–145)

## 2014-08-22 LAB — PLATELET COUNT: Platelets: 294 10*3/uL (ref 150–440)

## 2014-08-22 LAB — PHOSPHORUS: Phosphorus: 3 mg/dL (ref 2.5–4.6)

## 2014-08-22 LAB — MAGNESIUM: MAGNESIUM: 2.2 mg/dL (ref 1.7–2.4)

## 2014-08-22 MED ORDER — HALOPERIDOL LACTATE 5 MG/ML IJ SOLN
INTRAMUSCULAR | Status: AC
  Filled 2014-08-22: qty 1

## 2014-08-22 MED ORDER — POTASSIUM CHLORIDE 20 MEQ PO PACK
40.0000 meq | PACK | Freq: Two times a day (BID) | ORAL | Status: AC
  Administered 2014-08-22: 40 meq via ORAL
  Filled 2014-08-22: qty 2

## 2014-08-22 MED ORDER — DEXMEDETOMIDINE HCL IN NACL 200 MCG/50ML IV SOLN
0.4000 ug/kg/h | INTRAVENOUS | Status: DC
  Filled 2014-08-22: qty 50

## 2014-08-22 MED ORDER — VANCOMYCIN HCL 10 G IV SOLR
1250.0000 mg | Freq: Once | INTRAVENOUS | Status: AC
  Administered 2014-08-22: 1250 mg via INTRAVENOUS
  Filled 2014-08-22: qty 1250

## 2014-08-22 MED ORDER — HYDRALAZINE HCL 20 MG/ML IJ SOLN: 10.0000 mg | Freq: Four times a day (QID) | INTRAMUSCULAR | Status: DC | PRN

## 2014-08-22 MED ORDER — VANCOMYCIN HCL IN DEXTROSE 1-5 GM/200ML-% IV SOLN: 1000.0000 mg | INTRAVENOUS | Status: DC

## 2014-08-22 MED ORDER — VANCOMYCIN HCL 10 G IV SOLR
1250.0000 mg | Freq: Two times a day (BID) | INTRAVENOUS | Status: DC
  Administered 2014-08-23 – 2014-08-24 (×3): 1250 mg via INTRAVENOUS
  Filled 2014-08-22 (×5): qty 1250

## 2014-08-22 MED ORDER — HALOPERIDOL LACTATE 5 MG/ML IJ SOLN
INTRAMUSCULAR | Status: AC
  Administered 2014-08-22: 2 mg via INTRAVENOUS
  Filled 2014-08-22: qty 1

## 2014-08-22 MED ORDER — DEXMEDETOMIDINE BOLUS VIA INFUSION
1.0000 ug/kg | Freq: Once | INTRAVENOUS | Status: DC
  Filled 2014-08-22: qty 105

## 2014-08-22 MED ORDER — HALOPERIDOL LACTATE 5 MG/ML IJ SOLN
2.0000 mg | INTRAMUSCULAR | Status: DC | PRN
  Administered 2014-08-22 – 2014-08-23 (×4): 2 mg via INTRAVENOUS
  Filled 2014-08-22 (×4): qty 1

## 2014-08-22 MED ORDER — HALOPERIDOL LACTATE 5 MG/ML IJ SOLN
INTRAMUSCULAR | Status: AC
  Administered 2014-08-22: 5 mg via INTRAVENOUS
  Filled 2014-08-22: qty 1

## 2014-08-22 MED ORDER — DEXMEDETOMIDINE HCL IN NACL 400 MCG/100ML IV SOLN
0.4000 ug/kg/h | INTRAVENOUS | Status: DC
  Administered 2014-08-22: 1.2 ug/kg/h via INTRAVENOUS
  Administered 2014-08-22 (×2): 0.8 ug/kg/h via INTRAVENOUS
  Administered 2014-08-23: 1 ug/kg/h via INTRAVENOUS
  Administered 2014-08-23 (×3): 1.2 ug/kg/h via INTRAVENOUS
  Administered 2014-08-23: 0.8 ug/kg/h via INTRAVENOUS
  Administered 2014-08-23: 1.2 ug/kg/h via INTRAVENOUS
  Administered 2014-08-24: 0.8 ug/kg/h via INTRAVENOUS
  Administered 2014-08-24: 1.002 ug/kg/h via INTRAVENOUS
  Administered 2014-08-24: 0.762 ug/kg/h via INTRAVENOUS
  Administered 2014-08-24 (×2): 1 ug/kg/h via INTRAVENOUS
  Administered 2014-08-25 (×5): 0.8 ug/kg/h via INTRAVENOUS
  Administered 2014-08-25 – 2014-08-28 (×14): 1 ug/kg/h via INTRAVENOUS
  Filled 2014-08-22 (×24): qty 100
  Filled 2014-08-22: qty 50
  Filled 2014-08-22 (×2): qty 100
  Filled 2014-08-22: qty 50
  Filled 2014-08-22: qty 100

## 2014-08-22 MED ORDER — HALOPERIDOL LACTATE 5 MG/ML IJ SOLN
5.0000 mg | Freq: Once | INTRAMUSCULAR | Status: AC
  Administered 2014-08-22: 5 mg via INTRAVENOUS

## 2014-08-22 MED ORDER — LORAZEPAM 2 MG/ML IJ SOLN
1.0000 mg | INTRAMUSCULAR | Status: DC | PRN
  Administered 2014-08-22 – 2014-08-28 (×10): 2 mg via INTRAVENOUS
  Filled 2014-08-22 (×10): qty 1

## 2014-08-22 MED ORDER — IOHEXOL 350 MG/ML SOLN
100.0000 mL | Freq: Once | INTRAVENOUS | Status: AC | PRN
  Administered 2014-08-22: 100 mL via INTRAVENOUS

## 2014-08-22 MED ORDER — HYDRALAZINE HCL 20 MG/ML IJ SOLN
INTRAMUSCULAR | Status: AC
  Administered 2014-08-22: 20 mg
  Filled 2014-08-22: qty 1

## 2014-08-22 NOTE — Progress Notes (Signed)
Pulmonary Critical Care  Initial Consult Note   Alex Allison KVQ:[id number] DOB: 1945/12/17 DOA: 08/14/2014  Referring physician: Fidela Juneauichard Weiting, MD PCP: Corky DownsMASOUD,JAVED, MD   Chief Complaint: Cholecystitis Respiratory failure  HPI: Alex Allison is a 69 y.o. male with acute cholecystitis had ERCP now off the vent requiring high flow oxygen but seems to be improving. Awake but confused and agitated at times   Review of Systems:  Patient is not able to provide a ROS as he is confused  Past Medical History  Diagnosis Date  . Depressed   . Anxiety   . Polio   . Hypertension    Past Surgical History  Procedure Laterality Date  . Anal fissure repair     Social History:  reports that he has never smoked. He does not have any smokeless tobacco history on file. He reports that he does not drink alcohol or use illicit drugs.  No Known Allergies  History reviewed. No pertinent family history.  Prior to Admission medications   Medication Sig Start Date End Date Taking? Authorizing Provider  Cholecalciferol 2000 UNITS CAPS Take 1 capsule by mouth daily.   Yes Historical Provider, MD  Docusate Sodium 100 MG capsule Take 100 mg by mouth daily.   Yes Historical Provider, MD  gabapentin (NEURONTIN) 100 MG capsule Take 200 mg by mouth 2 (two) times daily.   Yes Historical Provider, MD  lisinopril (PRINIVIL,ZESTRIL) 20 MG tablet Take 20 mg by mouth every morning.   Yes Historical Provider, MD  Melatonin 5 MG TABS Take 1 tablet by mouth at bedtime.   Yes Historical Provider, MD  mirtazapine (REMERON) 45 MG tablet Take 22.5 mg by mouth at bedtime.   Yes Historical Provider, MD  OLANZapine (ZYPREXA) 2.5 MG tablet Take 2.5 mg by mouth at bedtime.   Yes Historical Provider, MD  sertraline (ZOLOFT) 50 MG tablet Take 50 mg by mouth daily.   Yes Historical Provider, MD  traZODone (DESYREL) 50 MG tablet Take 50 mg by mouth at bedtime.   Yes Historical Provider, MD  OLANZapine (ZYPREXA) 5 MG tablet  Take 5 mg by mouth 2 (two) times daily.    Historical Provider, MD   Physical Exam: Filed Vitals:   08/22/14 0500 08/22/14 0540 08/22/14 0600 08/22/14 0746  BP: 183/108  146/96   Pulse: 121     Temp:      TempSrc:      Resp: 20  21   Height:      Weight:  105 kg (231 lb 7.7 oz)    SpO2: 91%  94% 92%    Wt Readings from Last 3 Encounters:  08/22/14 105 kg (231 lb 7.7 oz)    General:  Appears anxious Eyes: PERRL, normal lids ENT: grossly normal lips & tongue Neck: no LAD, masses or thyromegaly Cardiovascular: RRR, no m/r/g. Respiratory: CTA bilaterally, no w/r/r. Abdomen: soft, nontender Skin: no rash or induration seen Musculoskeletal: grossly normal tone Psychiatric: anxious appearing Neurologic: unable to assess but grossly moves all four extremities          Labs on Admission:  Basic Metabolic Panel:  Recent Labs Lab 08/18/14 0717 08/21/2014 0511 08/20/14 0527 08/20/14 1106 08/21/14 0641 08/22/14 0423  NA 139 139 139  --  141 143  K 3.7 3.8 3.4*  --  3.7 3.1*  CL 105 105 107  --  107 105  CO2 27 26 26   --  27 26  GLUCOSE 123* 138* 204*  --  124*  82  BUN --  20 23*  CREATININE 0.67 0.67 0.72  --  0.97 0.99  CALCIUM 8.3* 8.1* 7.6*  --  7.8* 8.0*  MG  --   --   --  1.8 2.3 2.2  PHOS  --   --   --  2.2* 3.9 3.0   Liver Function Tests:  Recent Labs Lab 08/17/14 0436 08/18/14 0717 08/10/2014 0511 08/20/14 0527 08/21/14 0641  AST 212* 63* 60* 39 32  ALT 154* 84* 75* 57 55  ALKPHOS 119 103 98 88 92  BILITOT 3.6* 5.5* 5.8* 4.3* 2.4*  PROT 6.6 6.3* 5.6* 5.4* 5.9*  ALBUMIN 3.8 3.4* 3.1* 2.6* 2.9*    Recent Labs Lab 08/20/14 0526  LIPASE 32   No results for input(s): AMMONIA in the last 168 hours. CBC:  Recent Labs Lab 08/05/2014 1143 08/17/14 0436 08/18/14 0717 08/15/2014 0511 08/20/14 0527 08/22/14 0423  WBC 12.9* 9.5 6.4  --  4.3  --   NEUTROABS 10.6*  --   --   --   --   --   HGB 15.8 14.8 13.2  --  12.0*  --   HCT 48.1 45.3 40.1   --  36.5*  --   MCV 93.3 93.8 93.4  --  94.1  --   PLT 252 216 185 180 169 294   Cardiac Enzymes: No results for input(s): CKTOTAL, CKMB, CKMBINDEX, TROPONINI in the last 168 hours.  BNP (last 3 results) No results for input(s): BNP in the last 8760 hours.  ProBNP (last 3 results) No results for input(s): PROBNP in the last 8760 hours.  CBG:  Recent Labs Lab 08/05/2014 2151 08/17/2014 1841  GLUCAP 117* 156*    Radiological Exams on Admission: [place] Intraoperative  08/20/2014   CLINICAL DATA:    Ultrasound was provided for use by the ordering physician, and a technical  charge was applied by the performing facility.  No radiologist  interpretation/professional services rendered.    Ir Perc Cholecystostomy  08/20/2014   CLINICAL DATA:  Acute calculus cholecystitis, currently not and operative candidate  EXAM: ULTRASOUND FLUOROSCOPIC PERCUTANEOUS CHOLECYSTOSTOMY  Date:  5/19/20165/19/2016 3:57 pm  Radiologist:  M. Ruel Favors, MD  Guidance:  Ultrasound and fluoroscopic  FLUOROSCOPY TIME:  30 second  MEDICATIONS AND MEDICAL HISTORY: Patient is receiving IV propofol and intubated. Additional sedation not required. 1% lidocaine locally. Patient is already on IV antibiotics.  ANESTHESIA/SEDATION: None.  CONTRAST:  None.  COMPLICATIONS: None immediate  PROCEDURE: Informed consent was obtained from the patient following explanation of the procedure, risks, benefits and alternatives. The patient understands, agrees and consents for the procedure. All questions were addressed. A time out was performed.  Maximal barrier sterile technique utilized including caps, mask, sterile gowns, sterile gloves, large sterile drape, hand hygiene, and ChloraPrep.  Previous imaging reviewed. Patient positioned supine. Preliminary ultrasound performed. Gallbladder was localized and correlated with the prior CT. Under sterile conditions and local anesthesia, percutaneous needle access was performed under direct ultrasound  guidance. Needle position confirmed within the gallbladder. There was return of bile. A guidewire was advanced followed by the Accustick dilator set. Amplatz guidewire inserted. [place] tract dilatation performed. [place] multipurpose drain advanced. Retention loop formed in the gallbladder. Syringe aspiration yielded exudative bile. Sample sent for Gram stain and culture. Catheter secured with a Prolene suture. No immediate complication. Patient tolerated the procedure well.  IMPRESSION: Successful ultrasound fluoroscopic percutaneous cholecystostomy.   Electronically Signed   By:  M.  Shick M.D.   On: 08/20/2014 16:21   Dg Abd Portable 1v  08/20/2014   CLINICAL DATA:  NG-tube placement  EXAM: PORTABLE ABDOMEN - 1 VIEW  COMPARISON:  Portable exam 1852 hours compared to CT of 08/21/2014  FINDINGS: Nasogastric tube coiled in proximal stomach.  Pigtail cholecystostomy tube RIGHT upper quadrant.  Few air-filled minimally prominent loops of small bowel in the LEFT mid abdomen.  Small amount retained contrast in colon.  Marked osseous demineralization.  Pelvic soft tissue calcifications unchanged.  IMPRESSION: Nasogastric tube coiled in proximal stomach.  Cholecystostomy tube RIGHT upper quadrant.   Electronically Signed   By: Ulyses Southward M.D.   On: 08/20/2014 20:46    EKG: Independently reviewed.  Assessment/Plan Active Problems:   Acute calculous cholecystitis  1. Acute respiratory failure -patient is off the ventilator at this time -on high flow oxygen will monitor saturations -titrate fio2 as needed  2. Acute Cholecystitis -s/p ERCP with drainage -will monitor for ongoing fevers  3. Septic Shock? -will continue with unasyn   Code Status: Full Code  DVT Prophylaxis:SCD Family Communication: wife  Disposition Plan: Home   Time spent: Critical Care    I have personally obtained a history, examined the patient, evaluated laboratory and imaging results, formulated the  assessment and plan and placed orders.  The Patient requires high complexity decision making for assessment and support.    Yevonne Pax, MD [facility] Pulmonary Critical Care Medicine Sleep Medicine

## 2014-08-22 NOTE — Progress Notes (Signed)
Pt continues to attempt to take mitts off, placed due to pt pulling out pivs, monitoring equipt, sitter ordered at 0315 sitter obtained at 0415, multiple attempts made to calm pt, get pt to rest. Pt is a mouth breather oxygen at 5 liters due to this. Abg had been done earlier with no marked differences noted since pre extubation abg. Continue to monitor pt. And behavior. Explained to pt what behaviors would allow mitts to come off.

## 2014-08-22 NOTE — Progress Notes (Signed)
PHARMACY - CRITICAL CARE PROGRESS NOTE  Pharmacy Consult for Electrolyte Management   Indication: ICU Status   No Known Allergies  Patient Measurements: Height:  (180.3 cm) Weight: 231 lb 7.7 oz (105 kg) IBW/kg (Calculated) : 75.3   Vital Signs: BP: 146/96 mmHg (05/21 0600) Pulse Rate: 121 (05/21 0500) Intake/Output from previous day: 05/20 0701 - 05/21 0700 In: 1154.1 [P.O.:90; I.V.:664.1; IV Piggyback:400] Out: 1820 [Urine:1750; Drains:70] Intake/Output from this shift: Total I/O In: 118 [P.O.:118] Out: 2300 [Urine:2300] Vent settings for last 24 hours: Vent Mode:  [-]  FiO2 (%):  [50 %] 50 %  Labs:  Recent Labs  08/20/14 0527 08/20/14 1106 08/21/14 0641 08/22/14 0423  WBC 4.3  --   --   --   HGB 12.0*  --   --   --   HCT 36.5*  --   --   --   PLT 169  --   --  294  INR  --  1.01  --   --   CREATININE 0.72  --  0.97 0.99  MG  --  1.8 2.3 2.2  PHOS  --  2.2* 3.9 3.0  ALBUMIN 2.6*  --  2.9*  --   PROT 5.4*  --  5.9*  --   AST 39  --  32  --   ALT 57  --  55  --   ALKPHOS 88  --  92  --   BILITOT 4.3*  --  2.4*  --    Estimated Creatinine Clearance: 86.9 mL/min (by C-G formula based on Cr of 0.99).   Recent Labs  08/04/2014 1841  GLUCAP 156*    Microbiology: Recent Results (from the past 720 hour(s))  Culture, blood (routine x 2)     Status: None (Preliminary result)   Collection Time: 08/04/2014  6:50 PM  Result Value Ref Range Status   Specimen Description BLOOD  Final   Special Requests NONE  Final   Culture NO GROWTH 3 DAYS  Final   Report Status PENDING  Incomplete  Culture, blood (routine x 2)     Status: None (Preliminary result)   Collection Time: 08/23/2014  7:00 PM  Result Value Ref Range Status   Specimen Description BLOOD  Final   Special Requests NONE  Final   Culture NO GROWTH 3 DAYS  Final   Report Status PENDING  Incomplete  Body fluid culture     Status: None (Preliminary result)   Collection Time: 08/20/14 11:54 AM  Result  Value Ref Range Status   Specimen Description BILE  Final   Special Requests NONE  Final   Gram Stain FEW WBC SEEN NO ORGANISMS SEEN   Final   Culture NO GROWTH 2 DAYS  Final   Report Status PENDING  Incomplete  Anaerobic culture     Status: None (Preliminary result)   Collection Time: 08/20/14 11:54 AM  Result Value Ref Range Status   Specimen Description GALL  Final   Special Requests Normal  Final   Culture NO ANAEROBES ISOLATED  Final   Report Status PENDING  Incomplete    Medications:  Scheduled:  . ampicillin-sulbactam (UNASYN) IV  3 g Intravenous Q6H  . budesonide (PULMICORT) nebulizer solution  0.5 mg Nebulization BID  . chlorhexidine  15 mL Mouth/Throat BID  . famotidine (PEPCID) IV  20 mg Intravenous Q12H  . furosemide  40 mg Intravenous Daily  . gabapentin  200 mg Oral BID  . ipratropium-albuterol  3  mL Nebulization Q4H  . mirtazapine  22.5 mg Oral QHS  . OLANZapine  2.5 mg Oral QHS  . potassium & sodium phosphates  1 packet Oral TID  . potassium chloride  40 mEq Oral BID  . sertraline  50 mg Oral Daily  . traZODone  50 mg Oral QHS   Infusions:     Assessment: Pharmacy consulted to monitor and adjust electrolytes in this 69yo M admitted for acute cholecystitis s/p ERCP and possible percutaneous drainage. Patient was extubated on 5/20.   Plan:  1. Antibiotics - Unasyn 3gm IV Q6hrs Day 6 for acute cholecystitis. This is appropriate for renal function.   2. Electrolytes - K= 3.1. Will replace with KCl 40 mEq packets x 2 doses. Will order K with am labs.   Pharmacy will continue to monitor and adjust per consult.   Demetrius Charityeldrin D. Jassica Zazueta, PharmD 08/22/2014,11:55 AM

## 2014-08-22 NOTE — Progress Notes (Signed)
Surgery Progress Note  S: Agitated, confused overnight, sleeping this am O: AF/VSS, good uop, 70 cc from perc drain GEN: NAD, sleeping ABD: soft, mild tender, nondistended, JP with serosang  A/P 69 yo s/p ERCP, perc chole for choledocholithiasis, cholecystitis - continue supportive care, appreciate consultant input

## 2014-08-22 NOTE — Progress Notes (Signed)
Pt has been confused and restless all shift, pulling off monitoring equipt, pulling out pivs, argumentative with staff, believes he can go home, that there is truck parked outside his hospital room, multiple other statements by patient. Refused resp treatments, then later wanted them.  Sitter safety ordered apx. 0430. Pt has been moved in bed, repositioned, cleaned. Pt is a mouth breather and is on oxygen o2 had to be increased to 5 L in order to maintain saturation. Pt with anxiety most of shift , mittens had to be placed to protect rest of iv access sites. MD aware of situation throughout night, orders had been obtained for haldol and two doses of ativan, which did not help with pt management this shift.

## 2014-08-22 NOTE — Progress Notes (Signed)
Pt very confused, agitated wants to leave and go home, thinks there is a truck parked outside his door, multiple attempts to remove all monitoring equiptment. Multiple attempts of re- orienting pt to place, time, situation. Pt refusing treatment, refusing resp. Tx. Md notified and order received for haldol 5 mg IV medication given and pt continues to be refusing treatment. To rest. Continues to attempt to pull off scds, monitoring equipt. Pulse ox probe changed multiple times due to pt removing it. Did give pt resp tx. After much discussion with pt.  Pt wear tx for apx. 3 minutes before ripping it off and throwing it. Pt continues to take off o2 at times. Have re oriented pt many times as to need for oxygen, rest, and compliance with tx. Pt continues to state that he is not in hospital and that he can not sleep wants wife called to bring him home. Pulled out IV in left hand. Bleeding controlled. Continue to be in room with pt. At this time.

## 2014-08-22 NOTE — Progress Notes (Signed)
ANTIBIOTIC CONSULT NOTE - INITIAL  Pharmacy Consult for Vancomycin Indication: pneumonia  No Known Allergies  Patient Measurements: Height: 5\' 11"  (180.3 cm) Weight: 231 lb 7.7 oz (105 kg) IBW/kg (Calculated) : 75.3 Adjusted Body Weight: 87.2 kg  Vital Signs: BP: 146/96 mmHg (05/21 0600) Pulse Rate: 121 (05/21 0500) Intake/Output from previous day: 05/20 0701 - 05/21 0700 In: 1154.1 [P.O.:90; I.V.:664.1; IV Piggyback:400] Out: 1820 [Urine:1750; Drains:70] Intake/Output from this shift: Total I/O In: 118 [P.O.:118] Out: 2300 [Urine:2300]  Labs:  Recent Labs  08/20/14 0527 08/21/14 0641 08/22/14 0423  WBC 4.3  --   --   HGB 12.0*  --   --   PLT 169  --  294  CREATININE 0.72 0.97 0.99   Estimated Creatinine Clearance: 86.9 mL/min (by C-G formula based on Cr of 0.99). No results for input(s): VANCOTROUGH, VANCOPEAK, VANCORANDOM, GENTTROUGH, GENTPEAK, GENTRANDOM, TOBRATROUGH, TOBRAPEAK, TOBRARND, AMIKACINPEAK, AMIKACINTROU, AMIKACIN in the last 72 hours.   Microbiology: Recent Results (from the past 720 hour(s))  Culture, blood (routine x 2)     Status: None (Preliminary result)   Collection Time: 08/26/2014  6:50 PM  Result Value Ref Range Status   Specimen Description BLOOD  Final   Special Requests NONE  Final   Culture NO GROWTH 3 DAYS  Final   Report Status PENDING  Incomplete  Culture, blood (routine x 2)     Status: None (Preliminary result)   Collection Time: 08/08/2014  7:00 PM  Result Value Ref Range Status   Specimen Description BLOOD  Final   Special Requests NONE  Final   Culture NO GROWTH 3 DAYS  Final   Report Status PENDING  Incomplete  Body fluid culture     Status: None (Preliminary result)   Collection Time: 08/20/14 11:54 AM  Result Value Ref Range Status   Specimen Description BILE  Final   Special Requests NONE  Final   Gram Stain FEW WBC SEEN NO ORGANISMS SEEN   Final   Culture NO GROWTH 2 DAYS  Final   Report Status PENDING  Incomplete   Anaerobic culture     Status: None (Preliminary result)   Collection Time: 08/20/14 11:54 AM  Result Value Ref Range Status   Specimen Description GALL  Final   Special Requests Normal  Final   Culture NO ANAEROBES ISOLATED  Final   Report Status PENDING  Incomplete    Medical History: Past Medical History  Diagnosis Date  . Depressed   . Anxiety   . Polio   . Hypertension     Medications:  Scheduled:  . ampicillin-sulbactam (UNASYN) IV  3 g Intravenous Q6H  . budesonide (PULMICORT) nebulizer solution  0.5 mg Nebulization BID  . chlorhexidine  15 mL Mouth/Throat BID  . famotidine (PEPCID) IV  20 mg Intravenous Q12H  . furosemide  40 mg Intravenous Daily  . gabapentin  200 mg Oral BID  . ipratropium-albuterol  3 mL Nebulization Q4H  . mirtazapine  22.5 mg Oral QHS  . OLANZapine  2.5 mg Oral QHS  . potassium & sodium phosphates  1 packet Oral TID  . potassium chloride  40 mEq Oral BID  . sertraline  50 mg Oral Daily  . traZODone  50 mg Oral QHS  . vancomycin  1,250 mg Intravenous Once  . vancomycin  1,250 mg Intravenous Once  . [START ON 08/23/2014] vancomycin  1,250 mg Intravenous Q12H   Assessment: Patient is a 69 yo male admitted with respiratory failure with hypoxia.  Empiric therapy with Unazyn 3 gm IV q6h and IV Vancomycin is desired.  SCr: 0.99, est CrCl~86.9 mL/min, ke: 0.076, t1/2: 9.1 h, Vd: 61 L  Goal of Therapy:  Vancomycin trough level 15-20 mcg/ml  Plan:  Will order Vancomycin 1250 mg IV once with another dose 6 hours later for stacked dosing per pharmacy.  Will then start maintenance dosing of Vancomycin 1250 mg IV q12h.  Will order trough prior to 4th dose on 5/23 at 0730. Measure antibiotic drug levels at steady state Follow up culture results   Pharmacy will continue to follow.  Clarisa Schools, PharmD Clinical Pharmacist 08/22/2014

## 2014-08-22 NOTE — Progress Notes (Signed)
Called by nursing staff as pt. Was noted to be in worsening resp. Distress and agitated.  Upon arrival pt. Is appears diaphoretic, tachypnic with sitter & wife at bedside.   Vitals   P - 120, R- 34 BP - 163/113.  SpO2 - 92 on [place].  Gen - pt. Lying in bed agitated and mild resp. Distress.  Heart - S1, S2, No murmurs, rubs, clicks Lungs - tachypnic, + use of accessory muscles.  Poor resp. Effort.  Neuro - lethargic, follow simple commands.    ABG    Component Value Date/Time   PHART 7.44 08/22/2014 1745   PCO2ART 41 08/22/2014 1745   PO2ART 69* 08/22/2014 1745   HCO3 27.8 08/22/2014 1745   ACIDBASEDEF 2.8* July 30, 2014 1902   O2SAT 94.2 08/22/2014 1745    Assessment & Plan  1. Acute Resp. Failure - likely related to delerium agitation and complicated w/ underlying pneumonia as seen on cT chest earlier.  - cont. Vanc, Unasyn for pneumonia.  - ABG showing no evidence of hypercarbia.  - will give some Haldol for agitation and attempt to place on Bipap.  - repeat ABG in 1 hr.    2. AMS/Agitation - likely acute delerium.  - avoid benzo's likely ativan.  - cont. PRN Haldol.  Will try some Precedex gtt and cont. Sitter.   Critical Care time spent: 35 min.    Discussed plan w/ nursing staff.   Alex Allison,[name] 08/22/2014 6:09 PM

## 2014-08-22 NOTE — Progress Notes (Signed)
[facility] PROGRESS NOTE  PARSA RICKETT GEX:[id number] DOB: 18-Jan-1946 DOA: 08/03/2014 PCP: Corky Downs, MD  HPI/Subjective: Patient still on high flow oxygen and short of breath. He has been having a lot of back pain .   Objective: Filed Vitals:   08/22/14 0600  BP: 146/96  Pulse:   Temp:   Resp: 21    Intake/Output Summary (Last 24 hours) at 08/22/14 1206 Last data filed at 08/22/14 1140  Gross per 24 hour  Intake    418 ml  Output   4120 ml  Net  -3702 ml   Filed Weights   08/20/14 1537 08/21/14 0500 08/22/14 0540  Weight: 103.2 kg (227 lb 8.2 oz) 103.7 kg (228 lb 9.9 oz) 105 kg (231 lb 7.7 oz)    ROS: Review of Systems  Constitutional: Negative for fever and chills.  Eyes: Negative for blurred vision.  Respiratory: Negative for cough and positive for shortness of breath   Cardiovascular: Negative for chest pain.  Gastrointestinal: Positive for abdominal pain. Negative for nausea, vomiting, diarrhea and constipation.  Genitourinary: Negative for dysuria.  Musculoskeletal: Negative for joint pain, + back pain.  Neurological: Negative for dizziness and headaches.    Exam: Physical Exam  HENT:  Nose: No mucosal edema.  Eyes: Conjunctivae and lids are normal. Pupils are equal, round, and reactive to light.  Neck: No JVD present. Carotid bruit is not present. No edema present. No thyroid mass and no thyromegaly present.  Cardiovascular: Regular rhythm, S1 normal and S2 normal.  Exam reveals no gallop.   No murmur heard. Pulses:      Dorsalis pedis pulses are 1+ on the right side, and 1+ on the left side.  Respiratory: No respiratory distress. He has decreased breath sounds in the right lower field and the left lower field. He has no wheezes. He has rhonchi in the right lower field. He has no rales.  GI: Soft. Bowel sounds are normal. There is no hepatosplenomegaly. There is no tenderness.  Musculoskeletal:       Right ankle: He exhibits swelling.        Left ankle: He exhibits swelling.  Lymphadenopathy:    He has no cervical adenopathy.  Neurological: He is alert.  Skin: Skin is warm. No rash noted. Nails show no clubbing.  Psychiatric: He has a normal mood and affect.    Data Reviewed: Basic Metabolic Panel:  Recent Labs Lab 08/18/14 0717 08/05/2014 0511 08/20/14 0527 08/20/14 1106 08/21/14 0641 08/22/14 0423  NA 139 139 139  --  141 143  K 3.7 3.8 3.4*  --  3.7 3.1*  CL 105 105 107  --  107 105  CO2 --  27 26  GLUCOSE 123* 138* 204*  --  124* 82  BUN --  20 23*  CREATININE 0.67 0.67 0.72  --  0.97 0.99  CALCIUM 8.3* 8.1* 7.6*  --  7.8* 8.0*  MG  --   --   --  1.8 2.3 2.2  PHOS  --   --   --  2.2* 3.9 3.0   Liver Function Tests:  Recent Labs Lab 08/17/14 0436 08/18/14 0717 08/24/2014 0511 08/20/14 0527 08/21/14 0641  AST 212* 63* 60* 39 32  ALT 154* 84* 75* 57 55  ALKPHOS 119 103 98 88 92  BILITOT 3.6* 5.5* 5.8* 4.3* 2.4*  PROT 6.6 6.3* 5.6* 5.4* 5.9*  ALBUMIN 3.8 3.4* 3.1* 2.6* 2.9*  Recent Labs Lab 08/20/14 0526  LIPASE 32   CBC:  Recent Labs Lab 09/01/2014 1143 08/17/14 0436 08/18/14 0717 08/08/2014 0511 08/20/14 0527 08/22/14 0423  WBC 12.9* 9.5 6.4  --  4.3  --   NEUTROABS 10.6*  --   --   --   --   --   HGB 15.8 14.8 13.2  --  12.0*  --   HCT 48.1 45.3 40.1  --  36.5*  --   MCV 93.3 93.8 93.4  --  94.1  --   PLT 252 216 185 180 169 294     Recent Results (from the past 240 hour(s))  Culture, blood (routine x 2)     Status: None (Preliminary result)   Collection Time: 08/21/2014  6:50 PM  Result Value Ref Range Status   Specimen Description BLOOD  Final   Special Requests NONE  Final   Culture NO GROWTH 3 DAYS  Final   Report Status PENDING  Incomplete  Culture, blood (routine x 2)     Status: None (Preliminary result)   Collection Time: 08/22/2014  7:00 PM  Result Value Ref Range Status   Specimen Description BLOOD  Final   Special Requests NONE  Final    Culture NO GROWTH 3 DAYS  Final   Report Status PENDING  Incomplete  Body fluid culture     Status: None (Preliminary result)   Collection Time: 08/20/14 11:54 AM  Result Value Ref Range Status   Specimen Description BILE  Final   Special Requests NONE  Final   Gram Stain FEW WBC SEEN NO ORGANISMS SEEN   Final   Culture NO GROWTH 2 DAYS  Final   Report Status PENDING  Incomplete  Anaerobic culture     Status: None (Preliminary result)   Collection Time: 08/20/14 11:54 AM  Result Value Ref Range Status   Specimen Description GALL  Final   Special Requests Normal  Final   Culture NO ANAEROBES ISOLATED  Final   Report Status PENDING  Incomplete     Studies:   Scheduled Meds: . ampicillin-sulbactam (UNASYN) IV  3 g Intravenous Q6H  . budesonide (PULMICORT) nebulizer solution  0.5 mg Nebulization BID  . chlorhexidine  15 mL Mouth/Throat BID  . famotidine (PEPCID) IV  20 mg Intravenous Q12H  . furosemide  40 mg Intravenous Daily  . gabapentin  200 mg Oral BID  . ipratropium-albuterol  3 mL Nebulization Q4H  . mirtazapine  22.5 mg Oral QHS  . OLANZapine  2.5 mg Oral QHS  . potassium & sodium phosphates  1 packet Oral TID  . potassium chloride  40 mEq Oral BID  . sertraline  50 mg Oral Daily  . traZODone  50 mg Oral QHS    Assessment/Plan:  1. Acute respiratory failure with hypoxia; patient was reintubated after ERCP. Extubated on 08/21/2014. Currently on high flow  oxygen nasal cannula. His respiratory failure likely due to poor respiratory effort with atelectasis. 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  I will go ahead and add incentive spirometry to his current regimen. Also I will check a CT scan of his chest to make sure he does not have home and her embolism. 2. Acute cholecystitis with Choledocholelithiasis; patient is status post ERCP with sphincterotomy and stones extracted. Patient is also status post interventional radiology drainage of  gallbladder. Patient will likely go home with the gallbladder drain. Surgical team following. IV Unasyn. Surgery to  consider outpatient cholecystectomy. 3. Elevated liver function tests; this is secondary to the acute cholecystitis and choledochollithiasis. Continue to monitor 4. Polio, left sided weakness.  Unable to ambulate at this point. Has always used a brace for his left leg. 5. Back pain: Seen by Ortho Evra recommend course set for the back pain has lumbar fractures which needs to medically be managed   Code Status:     Code Status Orders        Start     Ordered   Aug 28, 2014 1430  Full code   Continuous     08/05/2014 1433    Advance Directive Documentation        Most Recent Value   Type of Advance Directive  Healthcare Power of Attorney, Living will   Pre-existing out of facility DNR order (yellow form or pink MOST form)     "MOST" Form in Place?       Family Communication: Patient's wife at the bedside, explained her the plan for CT scan and she is agreeable. Disposition Plan: To be determined  Time spent: 25 minutes  Allena Katz [facility]  [facility] Physicians PROGRESS NOTE  ALMOND FITZGIBBON ZOX:[id number] DOB: 1945/12/27 DOA: 08/04/2014 PCP: Corky Downs, MD  HPI/Subjective: Patient feeling cold right now. Shaking. Does not complain of shortness of breath. No cough. Some abdominal discomfort.  Objective: Filed Vitals:   08/22/14 0600  BP: 146/96  Pulse:   Temp:   Resp: 21    Intake/Output Summary (Last 24 hours) at 08/22/14 1207 Last data filed at 08/22/14 1140  Gross per 24 hour  Intake    418 ml  Output   4120 ml  Net  -3702 ml   Filed Weights   08/20/14 1537 08/21/14 0500 08/22/14 0540  Weight: 103.2 kg (227 lb 8.2 oz) 103.7 kg (228 lb 9.9 oz) 105 kg (231 lb 7.7 oz)    ROS: Review of Systems  Constitutional: Negative for fever and chills.  Eyes: Negative for blurred vision.  Respiratory: Negative  for cough and shortness of breath.   Cardiovascular: Negative for chest pain.  Gastrointestinal: Positive for abdominal pain. Negative for nausea, vomiting, diarrhea and constipation.  Genitourinary: Negative for dysuria.  Musculoskeletal: Negative for joint pain.  Neurological: Negative for dizziness and headaches.    Exam: Physical Exam  HENT:  Nose: No mucosal edema.  Eyes: Conjunctivae and lids are normal. Pupils are equal, round, and reactive to light.  Neck: No JVD present. Carotid bruit is not present. No edema present. No thyroid mass and no thyromegaly present.  Cardiovascular: Regular rhythm, S1 normal and S2 normal.  Exam reveals no gallop.   No murmur heard. Pulses:      Dorsalis pedis pulses are 1+ on the right side, and 1+ on the left side.  Respiratory: No respiratory distress. He has decreased breath sounds in the right lower field and the left lower field. He has no wheezes. He has rhonchi in the right lower field. He has no rales.  GI: Soft. Bowel sounds are normal. There is no hepatosplenomegaly. There is no tenderness.  Musculoskeletal:       Right ankle: He exhibits swelling.       Left ankle: He exhibits swelling.  Lymphadenopathy:    He has no cervical adenopathy.  Neurological: He is alert.  Skin: Skin is warm. No rash noted. Nails show no clubbing.  Psychiatric: He has a normal  mood and affect.    Data Reviewed: Basic Metabolic Panel:  Recent Labs Lab 08/18/14 0717 2014/09/09 0511 08/20/14 0527 08/20/14 1106 08/21/14 0641 08/22/14 0423  NA 139 139 139  --  141 143  K 3.7 3.8 3.4*  --  3.7 3.1*  CL 105 105 107  --  107 105  CO2 27 26 26   --  27 26  GLUCOSE 123* 138* 204*  --  124* 82  BUN 11 10 14   --  20 23*  CREATININE 0.67 0.67 0.72  --  0.97 0.99  CALCIUM 8.3* 8.1* 7.6*  --  7.8* 8.0*  MG  --   --   --  1.8 2.3 2.2  PHOS  --   --   --  2.2* 3.9 3.0   Liver Function Tests:  Recent Labs Lab 08/17/14 0436 08/18/14 0717 09-09-2014 0511  08/20/14 0527 08/21/14 0641  AST 212* 63* 60* 39 32  ALT 154* 84* 75* 57 55  ALKPHOS 119 103 98 88 92  BILITOT 3.6* 5.5* 5.8* 4.3* 2.4*  PROT 6.6 6.3* 5.6* 5.4* 5.9*  ALBUMIN 3.8 3.4* 3.1* 2.6* 2.9*    Recent Labs Lab 08/20/14 0526  LIPASE 32   CBC:  Recent Labs Lab 08/27/2014 1143 08/17/14 0436 08/18/14 0717 09/09/14 0511 08/20/14 0527 08/22/14 0423  WBC 12.9* 9.5 6.4  --  4.3  --   NEUTROABS 10.6*  --   --   --   --   --   HGB 15.8 14.8 13.2  --  12.0*  --   HCT 48.1 45.3 40.1  --  36.5*  --   MCV 93.3 93.8 93.4  --  94.1  --   PLT 252 216 185 180 169 294     Recent Results (from the past 240 hour(s))  Culture, blood (routine x 2)     Status: None (Preliminary result)   Collection Time: Sep 09, 2014  6:50 PM  Result Value Ref Range Status   Specimen Description BLOOD  Final   Special Requests NONE  Final   Culture NO GROWTH 3 DAYS  Final   Report Status PENDING  Incomplete  Culture, blood (routine x 2)     Status: None (Preliminary result)   Collection Time: 09-Sep-2014  7:00 PM  Result Value Ref Range Status   Specimen Description BLOOD  Final   Special Requests NONE  Final   Culture NO GROWTH 3 DAYS  Final   Report Status PENDING  Incomplete  Body fluid culture     Status: None (Preliminary result)   Collection Time: 08/20/14 11:54 AM  Result Value Ref Range Status   Specimen Description BILE  Final   Special Requests NONE  Final   Gram Stain FEW WBC SEEN NO ORGANISMS SEEN   Final   Culture NO GROWTH 2 DAYS  Final   Report Status PENDING  Incomplete  Anaerobic culture     Status: None (Preliminary result)   Collection Time: 08/20/14 11:54 AM  Result Value Ref Range Status   Specimen Description GALL  Final   Special Requests Normal  Final   Culture NO ANAEROBES ISOLATED  Final   Report Status PENDING  Incomplete     Studies:   Scheduled Meds: . ampicillin-sulbactam (UNASYN) IV  3 g Intravenous Q6H  . budesonide (PULMICORT) nebulizer solution  0.5  mg Nebulization BID  . chlorhexidine  15 mL Mouth/Throat BID  . famotidine (PEPCID) IV  20 mg Intravenous Q12H  . furosemide  40 mg Intravenous Daily  . gabapentin  200 mg Oral BID  . ipratropium-albuterol  3 mL Nebulization Q4H  . mirtazapine  22.5 mg Oral QHS  . OLANZapine  2.5 mg Oral QHS  . potassium & sodium phosphates  1 packet Oral TID  . potassium chloride  40 mEq Oral BID  . sertraline  50 mg Oral Daily  . traZODone  50 mg Oral QHS    Assessment/Plan:  6. Acute respiratory failure with hypoxia; patient was reintubated after ERCP. Extubated on 08/21/2014. Currently on 4.5 L of oxygen nasal cannula. Pulse ox wave is not that good and pulse ox is very variable. Continue to monitor overnight in the ICU. We will give incentive spirometry. On IV Lasix daily. 7. Acute cholecystitis with Choledocholelithiasis; patient is status post ERCP with sphincterotomy and stones extracted. Patient is also status post interventional radiology drainage of gallbladder. Patient will likely go home with the gallbladder drain. Surgical team following. IV Unasyn. Surgery to consider outpatient cholecystectomy. 8. Elevated liver function tests; this is secondary to the acute cholecystitis and choledochollithiasis. Bilirubin trending better today. 9. Polio, left sided weakness.  Unable to ambulate at this point. Has always used a brace for his left leg.   Code Status:     Code Status Orders        Start     Ordered   September 14, 2014 1430  Full code   Continuous     2014-09-14 1433    Advance Directive Documentation        Most Recent Value   Type of Advance Directive  Healthcare Power of Attorney, Living will   Pre-existing out of facility DNR order (yellow form or pink MOST form)     "MOST" Form in Place?       Family Communication: Patient's wife at the bedside. Disposition Plan: To be determined  Time spent: 25 minutes  Gerica Koble, [facility]  [facility] Hospitalists

## 2014-08-22 NOTE — Progress Notes (Signed)
Pt is not oriented to situation or place and this am was alert. Through the course of the day he became more alert. Was changed to [facility] [place] He had a Ct angiogram at 1100 am. It was (-) for PE. MD aware. Wife at bedside. Had foley in place and was D/Cd after 3rd dose of lasix this am. Had multiple incontinent episodes after wards. Around 4 pm had episode where he stated that he "was having a panic attack" BP was elevated and RR were increasing. Dr. Allena KatzPatel contacted and order for hydralazine was given and order for 2 mg of haldol. Pt was given 2 mg ativan, 2 mg haldol per Dr. Allena KatzPatel with no effect. Dr. Allena KatzPatel contacted again. Dan RN charge nurse was contacted for help. Sarah RN came to help. Dr. Allena KatzPatel contacted again and I asked for a doctor to come look at the pt. He told me that he would call Dr. Cherlynn KaiserSainani. Dr. Cherlynn KaiserSainani arrived and thought about intubating the pt, ABG done which showed ok results. He ordered another 5 mg of haldol, with no effect, BP still elevated and wife at bedside through this. Dr. Cherlynn KaiserSainani ordered precedex which was started and worked well. ABG ordered placed for 1930, foley placed, hiflo in place. Had BM, had bath and was shaved, wil continue to assess

## 2014-08-23 LAB — BASIC METABOLIC PANEL
Anion gap: 13 (ref 5–15)
BUN: 21 mg/dL — AB (ref 6–20)
CHLORIDE: 105 mmol/L (ref 101–111)
CO2: 24 mmol/L (ref 22–32)
Calcium: 7.9 mg/dL — ABNORMAL LOW (ref 8.9–10.3)
Creatinine, Ser: 0.93 mg/dL (ref 0.61–1.24)
GFR calc non Af Amer: >60 mL/min (ref >60)
GLUCOSE: 106 mg/dL — AB (ref 65–99)
POTASSIUM: 3.9 mmol/L (ref 3.5–5.1)
SODIUM: 142 mmol/L (ref 135–145)

## 2014-08-23 LAB — CBC
HEMATOCRIT: 41.7 % (ref 40.0–52.0)
HEMOGLOBIN: 13.6 g/dL (ref 13.0–18.0)
MCH: 30.3 pg (ref 26.0–34.0)
MCHC: 32.6 g/dL (ref 32.0–36.0)
MCV: 93 fL (ref 80.0–100.0)
Platelets: 256 10*3/uL (ref 150–440)
RBC: 4.48 MIL/uL (ref 4.40–5.90)
RDW: 14.1 % (ref 11.5–14.5)
WBC: 10.6 10*3/uL (ref 3.8–10.6)

## 2014-08-23 MED ORDER — SODIUM CHLORIDE 0.9 % IV SOLN
INTRAVENOUS | Status: DC
Start: 2014-08-23 — End: 2014-08-25
  Administered 2014-08-23 – 2014-08-25 (×4): via INTRAVENOUS

## 2014-08-23 MED ORDER — QUETIAPINE 12.5 MG HALF TABLET
12.5000 mg | ORAL_TABLET | Freq: Every day | ORAL | Status: DC
  Administered 2014-08-23: 12.5 mg via ORAL
  Administered 2014-08-24: 25 mg via ORAL
  Administered 2014-08-25 – 2014-08-27 (×3): 12.5 mg via ORAL
  Filled 2014-08-23 (×6): qty 1

## 2014-08-23 NOTE — Progress Notes (Signed)
Pulmonary Critical Care  Initial Consult Note   BRONCO MCGRORY ZOX:[id number] DOB: [birth year] DOA: 08/29/2014  Referring physician: Fidela Juneau, MD PCP: Corky Downs, MD   Chief Complaint: Cholecystitis Respiratory failure  HPI: Alex Allison is a 69 y.o. male with acute cholecystitis had ERCP now off the vent. Patient was placed on BIPAP and is on it this am. On and off confusion noted   Review of Systems:  Patient is not able to provide a ROS as he is confused  Past Medical History  Diagnosis Date  . Depressed   . Anxiety   . Polio   . Hypertension    Past Surgical History  Procedure Laterality Date  . Anal fissure repair     Social History:  reports that he has never smoked. He does not have any smokeless tobacco history on file. He reports that he does not drink alcohol or use illicit drugs.  No Known Allergies  History reviewed. No pertinent family history.  Prior to Admission medications   Medication Sig Start Date End Date Taking? Authorizing Provider  Cholecalciferol 2000 UNITS CAPS Take 1 capsule by mouth daily.   Yes Historical Provider, MD  Docusate Sodium 100 MG capsule Take 100 mg by mouth daily.   Yes Historical Provider, MD  gabapentin (NEURONTIN) 100 MG capsule Take 200 mg by mouth 2 (two) times daily.   Yes Historical Provider, MD  lisinopril (PRINIVIL,ZESTRIL) 20 MG tablet Take 20 mg by mouth every morning.   Yes Historical Provider, MD  Melatonin 5 MG TABS Take 1 tablet by mouth at bedtime.   Yes Historical Provider, MD  mirtazapine (REMERON) 45 MG tablet Take 22.5 mg by mouth at bedtime.   Yes Historical Provider, MD  OLANZapine (ZYPREXA) 2.5 MG tablet Take 2.5 mg by mouth at bedtime.   Yes Historical Provider, MD  sertraline (ZOLOFT) 50 MG tablet Take 50 mg by mouth daily.   Yes Historical Provider, MD  traZODone (DESYREL) 50 MG tablet Take 50 mg by mouth at bedtime.   Yes Historical Provider, MD  OLANZapine (ZYPREXA) 5 MG tablet Take 5 mg by  mouth 2 (two) times daily.    Historical Provider, MD   Physical Exam: Filed Vitals:   08/23/14 0900 08/23/14 0904 08/23/14 1000 08/23/14 1100  BP: 168/81  172/84 178/92  Pulse: 57 46 40 46  Temp:      TempSrc:      Resp: Height:      Weight:      SpO2: 95%  96% 97%    Wt Readings from Last 3 Encounters:  08/22/14 105 kg (231 lb 7.7 oz)    General:  Appears anxious Eyes: PERRL, normal lids ENT: grossly normal lips & tongue Neck: no LAD, masses or thyromegaly Cardiovascular: RRR, no m/r/g. Respiratory: CTA bilaterally, no w/r/r. Abdomen: soft, nontender Skin: no rash or induration seen Musculoskeletal: grossly normal tone Psychiatric: anxious appearing Neurologic: unable to assess but grossly moves all four extremities          Labs on Admission:  Basic Metabolic Panel:  Recent Labs Lab 08/20/2014 0511 08/20/14 0527 08/20/14 1106 08/21/14 0641 08/22/14 0423 08/23/14 0736  NA 139 139  --  141 143 142  K 3.8 3.4*  --  3.7 3.1* 3.9  CL 105 107  --  107 105 105  CO2 26 26  --  GLUCOSE 138* 204*  --  124* 82 106*  BUN 10 14  --  20 23* 21*  CREATININE 0.67 0.72  --  0.97 0.99 0.93  CALCIUM 8.1* 7.6*  --  7.8* 8.0* 7.9*  MG  --   --  1.8 2.3 2.2  --   PHOS  --   --  2.2* 3.9 3.0  --    Liver Function Tests:  Recent Labs Lab 08/17/14 0436 08/18/14 0717 08/08/2014 0511 08/20/14 0527 08/21/14 0641  AST 212* 63* 60* 39 32  ALT 154* 84* 75* 57 55  ALKPHOS 119 103 98 88 92  BILITOT 3.6* 5.5* 5.8* 4.3* 2.4*  PROT 6.6 6.3* 5.6* 5.4* 5.9*  ALBUMIN 3.8 3.4* 3.1* 2.6* 2.9*    Recent Labs Lab 08/20/14 0526  LIPASE 32   No results for input(s): AMMONIA in the last 168 hours. CBC:  Recent Labs Lab 08/17/14 0436 08/18/14 0717 08/14/2014 0511 08/20/14 0527 08/22/14 0423 08/23/14 0736  WBC 9.5 6.4  --  4.3  --  10.6  HGB 14.8 13.2  --  12.0*  --  13.6  HCT 45.3 40.1  --  36.5*  --  41.7  MCV 93.8 93.4  --  94.1  --  93.0  PLT 216  185 180 169 294 256   Cardiac Enzymes: No results for input(s): CKTOTAL, CKMB, CKMBINDEX, TROPONINI in the last 168 hours.  BNP (last 3 results) No results for input(s): BNP in the last 8760 hours.  ProBNP (last 3 results) No results for input(s): PROBNP in the last 8760 hours.  CBG:  Recent Labs Lab 08/21/2014 2151 08/03/2014 1841  GLUCAP 117* 156*    Radiological Exams on Admission: Ct Angio Chest Pe W/cm &/or Wo Cm  08/22/2014   CLINICAL DATA:  Patient extubated yesterday, acute onset of severe shortness of breath earlier today.  EXAM: CT ANGIOGRAPHY CHEST WITH CONTRAST  TECHNIQUE: Multidetector CT imaging of the chest was performed using the standard protocol during bolus administration of intravenous contrast. Multiplanar CT image reconstructions and MIPs were obtained to evaluate the vascular anatomy.  CONTRAST:  OMNIPAQUE IOHEXOL 350 MG/ML IV.  COMPARISON:  CTA chest 06/09/2013.  FINDINGS: Beam hardening streak artifact as the patient was unable or unwilling to raise the arms. Respiratory motion blurs many of the images. Contrast opacification of the pulmonary arteries is good. Overall, the study is of fair diagnostic quality.  No filling defects within either main pulmonary artery or their central branches in either lung to suggest pulmonary embolism. Heart moderately enlarged with 3 vessel coronary atherosclerosis. No pericardial effusion. Mild atherosclerosis involving the aortic arch and the proximal great vessels.  Markedly suboptimal inspiration with very low lung volumes. Dense airspace consolidation with air bronchograms in the lower lobes. Airspace opacities in the right upper lobe and right middle lobe. Minimal airspace opacity in the left upper lobe. Small bilateral pleural effusions. Central airways patent.  No significant mediastinal, hilar, axillary or supraclavicular lymphadenopathy.  Gas within the nondistended independent bile ducts in the visualized liver. Large  hiatal hernia as noted previously, with nearly all of the stomach present in the thorax. Visualized upper abdomen otherwise unremarkable.  Review of the MIP images confirms the above findings.  IMPRESSION: 1. Technically suboptimal examination for the reasons described above. No evidence of central pulmonary embolism. 2. Multilobar pneumonia involving both lungs, most confluent in the lower lobes. 3. Small bilateral pleural effusions. 4. Large hiatal hernia. 5. Gas within nondistended bile ducts in the liver, presumably related to prior biliary intervention.   Electronically Signed   By: Maisie Fus  Lawrence M.D.   On: 08/22/2014 11:56    EKG: Independently reviewed.  Assessment/Plan Active Problems:   Acute calculous cholecystitis  1. Acute respiratory failure with hypoxia -on bipap now. Will monitor saturations -Follow up ABG as needed -titrate fio2 as needed  2. Acute Cholecystitis -s/p ERCP with drainage  3. Septic Shock? -will continue with unasyn   Code Status: Full Code  DVT Prophylaxis:SCD Family Communication: wife  Disposition Plan: Home   Time spent: 35min Critical Care    I have personally obtained a history, examined the patient, evaluated laboratory and imaging results, formulated the assessment and plan and placed orders.  The Patient requires high complexity decision making for assessment and support.    Yevonne PaxSaadat A Joslyne Marshburn, MD [facility]CC[facility] Pulmonary Critical Care Medicine Sleep Medicine

## 2014-08-23 NOTE — Progress Notes (Signed)
[facility] PROGRESS NOTE  Alex Allison ZOX:[id number] DOB: [birth year] DOA: 08/06/2014 PCP: Corky DownsMASOUD,JAVED, MD  HPI/Subjective: Patient's work of breathing increased yesterday he was very anxious. Had to be placed on BiPAP was given haloperidol,Today he continues to be on BiPAP. And also started on Precedex. He is currently unable to give any further history his wife is at the bedside.    Objective: Filed Vitals:   08/23/14 1100  BP: 178/92  Pulse: 46  Temp:   Resp: 17    Intake/Output Summary (Last 24 hours) at 08/23/14 1149 Last data filed at 08/23/14 [id number]  Gross per 24 hour  Intake 1055.08 ml  Output   1105 ml  Net -49.92 ml   Filed Weights   08/20/14 1537 08/21/14 0500 08/22/14 0540  Weight: 103.2 kg (227 lb 8.2 oz) 103.7 kg (228 lb 9.9 oz) 105 kg (231 lb 7.7 oz)    ROS: Review of Systems   unable to give any review of systems due to his mental status   Exam: Physical Exam  General: Critically ill-appearing male , BiPAP  HENT:  Nose: No mucosal edema.  Eyes: Conjunctivae and lids are normal. Pupils are equal, round, and reactive to light.  Neck: No JVD present. Carotid bruit is not present. No edema present. No thyroid mass and no thyromegaly present.  Cardiovascular: Regular rhythm, S1 normal and S2 normal.  Exam reveals no gallop.   No murmur heard. Pulses:      Dorsalis pedis pulses are 1+ on the right side, and 1+ on the left side.  Respiratory: No respiratory distress. He has decreased breath sounds in the right lower field and the left lower field. He has no wheezes. He has rhonchi in the right lower field. He has no rales.  GI: Soft. Bowel sounds are normal. There is no hepatosplenomegaly. There is no tenderness.  Musculoskeletal:       Right ankle: He exhibits swelling.       Left ankle: He exhibits swelling.  Lymphadenopathy:    He has no cervical adenopathy.  Neurological: He is alert.  Skin: Skin is warm. No rash noted. Nails show no  clubbing.  Psychiatric: He has a normal mood and affect.    Data Reviewed: Basic Metabolic Panel:  Recent Labs Lab 08/21/2014 0511 08/20/14 0527 08/20/14 1106 08/21/14 0641 08/22/14 0423 08/23/14 0736  NA 139 139  --  141 143 142  K 3.8 3.4*  --  3.7 3.1* 3.9  CL 105 107  --  107 105 105  CO2 26 26  --  27 26 24   GLUCOSE 138* 204*  --  124* 82 106*  BUN 10 14  --  20 23* 21*  CREATININE 0.67 0.72  --  0.97 0.99 0.93  CALCIUM 8.1* 7.6*  --  7.8* 8.0* 7.9*  MG  --   --  1.8 2.3 2.2  --   PHOS  --   --  2.2* 3.9 3.0  --    Liver Function Tests:  Recent Labs Lab 08/17/14 0436 08/18/14 0717 08/23/2014 0511 08/20/14 0527 08/21/14 0641  AST 212* 63* 60* 39 32  ALT 154* 84* 75* 57 55  ALKPHOS 119 103 98 88 92  BILITOT 3.6* 5.5* 5.8* 4.3* 2.4*  PROT 6.6 6.3* 5.6* 5.4* 5.9*  ALBUMIN 3.8 3.4* 3.1* 2.6* 2.9*    Recent Labs Lab 08/20/14 0526  LIPASE 32   CBC:  Recent Labs Lab 08/17/14 0436 08/18/14 0717 08/14/2014 0511 08/20/14 [id number]  08/22/14 0423 08/23/14 0736  WBC 9.5 6.4  --  4.3  --  10.6  HGB 14.8 13.2  --  12.0*  --  13.6  HCT 45.3 40.1  --  36.5*  --  41.7  MCV 93.8 93.4  --  94.1  --  93.0  PLT 216 185 180 169 294 256     Recent Results (from the past 240 hour(s))  Culture, blood (routine x 2)     Status: None (Preliminary result)   Collection Time: 08/26/2014  6:50 PM  Result Value Ref Range Status   Specimen Description BLOOD  Final   Special Requests NONE  Final   Culture NO GROWTH 4 DAYS  Final   Report Status PENDING  Incomplete  Culture, blood (routine x 2)     Status: None (Preliminary result)   Collection Time: 08/08/2014  7:00 PM  Result Value Ref Range Status   Specimen Description BLOOD  Final   Special Requests NONE  Final   Culture NO GROWTH 4 DAYS  Final   Report Status PENDING  Incomplete  Body fluid culture     Status: None (Preliminary result)   Collection Time: 08/20/14 11:54 AM  Result Value Ref Range Status   Specimen Description  BILE  Final   Special Requests NONE  Final   Gram Stain FEW WBC SEEN NO ORGANISMS SEEN   Final   Culture No growth in 36 hours  Final   Report Status PENDING  Incomplete  Anaerobic culture     Status: None (Preliminary result)   Collection Time: 08/20/14 11:54 AM  Result Value Ref Range Status   Specimen Description GALL  Final   Special Requests Normal  Final   Culture NO ANAEROBES ISOLATED  Final   Report Status PENDING  Incomplete     Studies:   Scheduled Meds: . ampicillin-sulbactam (UNASYN) IV  3 g Intravenous Q6H  . budesonide (PULMICORT) nebulizer solution  0.5 mg Nebulization BID  . chlorhexidine  15 mL Mouth/Throat BID  . famotidine (PEPCID) IV  20 mg Intravenous Q12H  . gabapentin  200 mg Oral BID  . ipratropium-albuterol  3 mL Nebulization Q4H  . mirtazapine  22.5 mg Oral QHS  . OLANZapine  2.5 mg Oral QHS  . QUEtiapine  12.5 mg Oral QHS  . sertraline  50 mg Oral Daily  . traZODone  50 mg Oral QHS  . vancomycin  1,250 mg Intravenous Q12H    Assessment/Plan:  1. Acute respiratory failure with hypoxia; patient was reintubated after ERCP. Extubated on 08/21/2014. CT scan of the chest shows bilateral pneumonia as well as effusions. At this point would continue treatment with BiPAP unless intubation is absolutely needed. Patient is being followed by the intensivist they can also assess his respiratory status , and make further recommendations regarding this. We'll continue broad-spectrum antibiotics with vancomycin and Unasyn  2. Acute cholecystitis with Choledocholelithiasis; patient is status post ERCP with sphincterotomy and stones extracted. Patient is also status post interventional radiology drainage of gallbladder. Patient will likely go home with the gallbladder drain. Surgical team following. IV Unasyn. Surgery to consider outpatient cholecystectomy. 3. Elevated liver function tests; this is secondary to the acute cholecystitis and choledochollithiasis. Continue  to monitor 4. Polio, left sided weakness.  Unable to ambulate at this point. Has always used a brace for his left leg. 5. Back pain: Seen by Ortho Evra recommend course set for the back pain has lumbar fractures which needs to medically be  managed   Code Status:     Code Status Orders        Start     Ordered   2014-09-07 1430  Full code   Continuous     September 07, 2014 1433    Advance Directive Documentation        Most Recent Value   Type of Advance Directive  Healthcare Power of Attorney, Living will   Pre-existing out of facility DNR order (yellow form or pink MOST form)     "MOST" Form in Place?       Family Communication: Patient's wife at the bedsideexplained to her the plan of care  Time spent:  48 minuteses Critical care time patient at high risk of cardiopulmonary arrest.  Allena Katz [facility]  [facility] PROGRESS NOTE  Alex Allison ONG:[id number] DOB: 1945-08-09 DOA: Sep 07, 2014 PCP: Corky Downs, MD  HPI/Subjective: Patient feeling cold right now. Shaking. Does not complain of shortness of breath. No cough. Some abdominal discomfort.  Objective: Filed Vitals:   08/23/14 1100  BP: 178/92  Pulse: 46  Temp:   Resp: 17    Intake/Output Summary (Last 24 hours) at 08/23/14 1149 Last data filed at 08/23/14 4401  Gross per 24 hour  Intake 1055.08 ml  Output   1105 ml  Net -49.92 ml   Filed Weights   08/20/14 1537 08/21/14 0500 08/22/14 0540  Weight: 103.2 kg (227 lb 8.2 oz) 103.7 kg (228 lb 9.9 oz) 105 kg (231 lb 7.7 oz)    ROS: Review of Systems  Constitutional: Negative for fever and chills.  Eyes: Negative for blurred vision.  Respiratory: Negative for cough and shortness of breath.   Cardiovascular: Negative for chest pain.  Gastrointestinal: Positive for abdominal pain. Negative for nausea, vomiting, diarrhea and constipation.  Genitourinary: Negative for dysuria.  Musculoskeletal: Negative for  joint pain.  Neurological: Negative for dizziness and headaches.    Exam: Physical Exam  HENT:  Nose: No mucosal edema.  Eyes: Conjunctivae and lids are normal. Pupils are equal, round, and reactive to light.  Neck: No JVD present. Carotid bruit is not present. No edema present. No thyroid mass and no thyromegaly present.  Cardiovascular: Regular rhythm, S1 normal and S2 normal.  Exam reveals no gallop.   No murmur heard. Pulses:      Dorsalis pedis pulses are 1+ on the right side, and 1+ on the left side.  Respiratory: No respiratory distress. He has decreased breath sounds in the right lower field and the left lower field. He has no wheezes. He has rhonchi in the right lower field. He has no rales.  GI: Soft. Bowel sounds are normal. There is no hepatosplenomegaly. There is no tenderness.  Musculoskeletal:       Right ankle: He exhibits swelling.       Left ankle: He exhibits swelling.  Lymphadenopathy:    He has no cervical adenopathy.  Neurological: He is alert.  Skin: Skin is warm. No rash noted. Nails show no clubbing.  Psychiatric: He has a normal mood and affect.    Data Reviewed: Basic Metabolic Panel:  Recent Labs Lab 08/21/2014 0511 08/20/14 0527 08/20/14 1106 08/21/14 0641 08/22/14 0423 08/23/14 0736  NA 139 139  --  141 143 142  K 3.8 3.4*  --  3.7 3.1* 3.9  CL 105 107  --  107 105 105  CO2 26 26  --  27 26  24  GLUCOSE 138* 204*  --  124* 82 106*  BUN 10 14  --  20 23* 21*  CREATININE 0.67 0.72  --  0.97 0.99 0.93  CALCIUM 8.1* 7.6*  --  7.8* 8.0* 7.9*  MG  --   --  1.8 2.3 2.2  --   PHOS  --   --  2.2* 3.9 3.0  --    Liver Function Tests:  Recent Labs Lab 08/17/14 0436 08/18/14 0717 08/08/2014 0511 08/20/14 0527 08/21/14 0641  AST 212* 63* 60* 39 32  ALT 154* 84* 75* 57 55  ALKPHOS 119 103 98 88 92  BILITOT 3.6* 5.5* 5.8* 4.3* 2.4*  PROT 6.6 6.3* 5.6* 5.4* 5.9*  ALBUMIN 3.8 3.4* 3.1* 2.6* 2.9*    Recent Labs Lab 08/20/14 0526  LIPASE 32    CBC:  Recent Labs Lab 08/17/14 0436 08/18/14 0717 08/21/2014 0511 08/20/14 0527 08/22/14 0423 08/23/14 0736  WBC 9.5 6.4  --  4.3  --  10.6  HGB 14.8 13.2  --  12.0*  --  13.6  HCT 45.3 40.1  --  36.5*  --  41.7  MCV 93.8 93.4  --  94.1  --  93.0  PLT 216 185 180 169 294 256     Recent Results (from the past 240 hour(s))  Culture, blood (routine x 2)     Status: None (Preliminary result)   Collection Time: 08/18/2014  6:50 PM  Result Value Ref Range Status   Specimen Description BLOOD  Final   Special Requests NONE  Final   Culture NO GROWTH 4 DAYS  Final   Report Status PENDING  Incomplete  Culture, blood (routine x 2)     Status: None (Preliminary result)   Collection Time: 08/27/2014  7:00 PM  Result Value Ref Range Status   Specimen Description BLOOD  Final   Special Requests NONE  Final   Culture NO GROWTH 4 DAYS  Final   Report Status PENDING  Incomplete  Body fluid culture     Status: None (Preliminary result)   Collection Time: 08/20/14 11:54 AM  Result Value Ref Range Status   Specimen Description BILE  Final   Special Requests NONE  Final   Gram Stain FEW WBC SEEN NO ORGANISMS SEEN   Final   Culture No growth in 36 hours  Final   Report Status PENDING  Incomplete  Anaerobic culture     Status: None (Preliminary result)   Collection Time: 08/20/14 11:54 AM  Result Value Ref Range Status   Specimen Description GALL  Final   Special Requests Normal  Final   Culture NO ANAEROBES ISOLATED  Final   Report Status PENDING  Incomplete     Studies:   Scheduled Meds: . ampicillin-sulbactam (UNASYN) IV  3 g Intravenous Q6H  . budesonide (PULMICORT) nebulizer solution  0.5 mg Nebulization BID  . chlorhexidine  15 mL Mouth/Throat BID  . famotidine (PEPCID) IV  20 mg Intravenous Q12H  . gabapentin  200 mg Oral BID  . ipratropium-albuterol  3 mL Nebulization Q4H  . mirtazapine  22.5 mg Oral QHS  . OLANZapine  2.5 mg Oral QHS  . QUEtiapine  12.5 mg Oral QHS  .  sertraline  50 mg Oral Daily  . traZODone  50 mg Oral QHS  . vancomycin  1,250 mg Intravenous Q12H    Assessment/Plan:  6. Acute respiratory failure with hypoxia; patient was reintubated after ERCP. Extubated on 08/21/2014. Currently on 4.5  L of oxygen nasal cannula. Pulse ox wave is not that good and pulse ox is very variable. Continue to monitor overnight in the ICU. We will give incentive spirometry. On IV Lasix daily. 7. Acute cholecystitis with Choledocholelithiasis; patient is status post ERCP with sphincterotomy and stones extracted. Patient is also status post interventional radiology drainage of gallbladder. Patient will likely go home with the gallbladder drain. Surgical team following. IV Unasyn. Surgery to consider outpatient cholecystectomy. 8. Elevated liver function tests; this is secondary to the acute cholecystitis and choledochollithiasis. Bilirubin trending better today. 9. Polio, left sided weakness.  Unable to ambulate at this point. Has always used a brace for his left leg.   Code Status:     Code Status Orders        Start     Ordered   08/12/2014 1430  Full code   Continuous     08/27/2014 1433    Advance Directive Documentation        Most Recent Value   Type of Advance Directive  Healthcare Power of Attorney, Living will   Pre-existing out of facility DNR order (yellow form or pink MOST form)     "MOST" Form in Place?       Family Communication: Patient's wife at the bedside. Disposition Plan: To be determined  Time spent: 25 minutes  Angelita Harnack, [facility]  [facility]

## 2014-08-23 NOTE — Progress Notes (Signed)
Surgery Progress Note  S: Agitation overnight and this am O: AF/VSS, good uop GEN: NAD/sedated RESP: CPAP on, appears comfortable ABD: soft, min tender, nondistended, perc chole tube with minimal output  A/P 69 yo admit with choledocholithiasis, ? Cholecystitis - continue supportive management

## 2014-08-23 NOTE — Progress Notes (Signed)
Dr. Allena KatzPatel called and notified that pt has only had around 14400ml's of urine out this shift. Pt was bladder scanned and only 150 ml's was noted in the urine. Pt has been resting quietly throughout the shift and has not had anything to drink or eat. Dr. Allena KatzPatel ordered NS for pt. Kolby Schara E 5:11 PM 08/23/2014

## 2014-08-24 ENCOUNTER — Inpatient Hospital Stay: Payer: Medicare HMO

## 2014-08-24 LAB — BLOOD GAS, ARTERIAL
Acid-base deficit: 1.7 mmol/L (ref 0.0–2.0)
Acid-base deficit: 0.7 mmol/L (ref 0.0–2.0)
BICARBONATE: 23 meq/L (ref 21.0–28.0)
BICARBONATE: 24.2 meq/L (ref 21.0–28.0)
DRAWN BY: DETECTED
Delivery systems: POSITIVE
Expiratory PAP: 5
Expiratory PAP: 5
FIO2: 0.5 %
FIO2: 50 %
INSPIRATORY PAP: 12
Inspiratory PAP: 12
Mode: POSITIVE
O2 SAT: 95.9 %
O2 Saturation: 93.4 %
PCO2 ART: 40 mmHg (ref 32.0–48.0)
Patient temperature: 37
Patient temperature: 37
RATE: 8 resp/min
RATE: 8 resp/min
pCO2 arterial: 38 mmHg (ref 32.0–48.0)
pH, Arterial: 7.39 (ref 7.350–7.450)
pH, Arterial: 7.39 (ref 7.350–7.450)
pO2, Arterial: 82 mmHg — ABNORMAL LOW (ref 83.0–108.0)
pO2, Arterial: 69 mmHg — ABNORMAL LOW (ref 83.0–108.0)

## 2014-08-24 LAB — CBC
HEMATOCRIT: 43.7 % (ref 40.0–52.0)
Hemoglobin: 14.1 g/dL (ref 13.0–18.0)
MCH: 30.3 pg (ref 26.0–34.0)
MCHC: 32.3 g/dL (ref 32.0–36.0)
MCV: 93.9 fL (ref 80.0–100.0)
Platelets: 286 10*3/uL (ref 150–440)
RBC: 4.65 MIL/uL (ref 4.40–5.90)
RDW: 14.3 % (ref 11.5–14.5)
WBC: 13.4 10*3/uL — ABNORMAL HIGH (ref 3.8–10.6)

## 2014-08-24 LAB — BASIC METABOLIC PANEL
ANION GAP: 11 (ref 5–15)
BUN: 21 mg/dL — ABNORMAL HIGH (ref 6–20)
CO2: 24 mmol/L (ref 22–32)
Calcium: 7.9 mg/dL — ABNORMAL LOW (ref 8.9–10.3)
Chloride: 108 mmol/L (ref 101–111)
Creatinine, Ser: 0.76 mg/dL (ref 0.61–1.24)
GFR calc non Af Amer: >60 mL/min (ref >60)
Glucose, Bld: 120 mg/dL — ABNORMAL HIGH (ref 65–99)
Potassium: 3.5 mmol/L (ref 3.5–5.1)
Sodium: 143 mmol/L (ref 135–145)

## 2014-08-24 LAB — GLUCOSE, CAPILLARY: Glucose-Capillary: 102 mg/dL — ABNORMAL HIGH (ref 65–99)

## 2014-08-24 LAB — BODY FLUID CULTURE: Culture: NO GROWTH

## 2014-08-24 LAB — VANCOMYCIN, TROUGH: VANCOMYCIN TR: 24 ug/mL — AB (ref 10–20)

## 2014-08-24 LAB — ANAEROBIC CULTURE: Special Requests: NORMAL

## 2014-08-24 LAB — MAGNESIUM: Magnesium: 2.3 mg/dL (ref 1.7–2.4)

## 2014-08-24 LAB — MRSA PCR SCREENING: MRSA BY PCR: NEGATIVE

## 2014-08-24 MED ORDER — MIDAZOLAM HCL 2 MG/2ML IJ SOLN
INTRAMUSCULAR | Status: AC
  Administered 2014-08-24: 2 mg
  Filled 2014-08-24: qty 2

## 2014-08-24 MED ORDER — DOCUSATE SODIUM 50 MG/5ML PO LIQD
100.0000 mg | Freq: Two times a day (BID) | ORAL | Status: DC
  Administered 2014-08-24 – 2014-08-28 (×8): 100 mg via ORAL
  Filled 2014-08-24 (×8): qty 10

## 2014-08-24 MED ORDER — FREE WATER
25.0000 mL | Status: DC
  Administered 2014-08-24 – 2014-08-26 (×11): 25 mL

## 2014-08-24 MED ORDER — FENTANYL 2500MCG IN NS 250ML (10MCG/ML) PREMIX INFUSION
10.0000 ug/h | INTRAVENOUS | Status: DC
  Administered 2014-08-24 – 2014-08-25 (×2): 10 ug/h via INTRAVENOUS
  Administered 2014-08-25: 200 ug/h via INTRAVENOUS
  Filled 2014-08-24 (×4): qty 250

## 2014-08-24 MED ORDER — FENTANYL CITRATE (PF) 100 MCG/2ML IJ SOLN
INTRAMUSCULAR | Status: AC
  Administered 2014-08-24: 100 ug via INTRAVENOUS
  Filled 2014-08-24: qty 2

## 2014-08-24 MED ORDER — MIDAZOLAM HCL 2 MG/2ML IJ SOLN
4.0000 mg | Freq: Once | INTRAMUSCULAR | Status: AC
  Administered 2014-08-24: 4 mg via INTRAVENOUS

## 2014-08-24 MED ORDER — VANCOMYCIN HCL 10 G IV SOLR
1250.0000 mg | INTRAVENOUS | Status: DC
  Filled 2014-08-24: qty 1250

## 2014-08-24 MED ORDER — MORPHINE SULFATE 2 MG/ML IJ SOLN
INTRAMUSCULAR | Status: AC
  Filled 2014-08-24: qty 1

## 2014-08-24 MED ORDER — MIDAZOLAM HCL 2 MG/2ML IJ SOLN
INTRAMUSCULAR | Status: AC
  Administered 2014-08-24: 4 mg via INTRAVENOUS
  Filled 2014-08-24: qty 4

## 2014-08-24 MED ORDER — VECURONIUM BROMIDE 10 MG IV SOLR
INTRAVENOUS | Status: AC
  Administered 2014-08-24: 10 mg via INTRAVENOUS
  Filled 2014-08-24: qty 10

## 2014-08-24 MED ORDER — FENTANYL CITRATE (PF) 100 MCG/2ML IJ SOLN
100.0000 ug | Freq: Once | INTRAMUSCULAR | Status: AC
  Administered 2014-08-24: 100 ug via INTRAVENOUS

## 2014-08-24 MED ORDER — SENNOSIDES 8.8 MG/5ML PO SYRP
5.0000 mL | ORAL_SOLUTION | Freq: Two times a day (BID) | ORAL | Status: DC
  Administered 2014-08-24 – 2014-08-25 (×2): 5 mL via ORAL
  Filled 2014-08-24 (×2): qty 5

## 2014-08-24 MED ORDER — VECURONIUM BROMIDE 10 MG IV SOLR
10.0000 mg | Freq: Once | INTRAVENOUS | Status: AC
  Administered 2014-08-24: 10 mg via INTRAVENOUS

## 2014-08-24 MED ORDER — VITAL HIGH PROTEIN PO LIQD
1000.0000 mL | ORAL | Status: DC
  Administered 2014-08-24: 1000 mL

## 2014-08-24 MED ORDER — MIDAZOLAM HCL 2 MG/2ML IJ SOLN
2.0000 mg | INTRAMUSCULAR | Status: DC | PRN
  Administered 2014-08-25 (×2): 2 mg via INTRAVENOUS
  Filled 2014-08-24 (×2): qty 2

## 2014-08-24 NOTE — Consult Note (Signed)
PULMONARY / CRITICAL CARE MEDICINE   Name: Alex Allison MRN: [id number] DOB: 1945-07-03    ADMISSION DATE:  08/10/2014 CONSULT        Patient  Patient extubated, placed on biPAP due to resp distress, deleriuos agitated -placed on precedex infusion  Patient at risk for re-intubation  :  Past Medical History  Diagnosis Date  . Depressed   . Anxiety   . Polio   . Hypertension    Past Surgical History  Procedure Laterality Date  . Anal fissure repair     Prior to Admission medications   Medication Sig Start Date End Date Taking? Authorizing Provider  Cholecalciferol 2000 UNITS CAPS Take 1 capsule by mouth daily.   Yes Historical Provider, MD  Docusate Sodium 100 MG capsule Take 100 mg by mouth daily.   Yes Historical Provider, MD  gabapentin (NEURONTIN) 100 MG capsule Take 200 mg by mouth 2 (two) times daily.   Yes Historical Provider, MD  lisinopril (PRINIVIL,ZESTRIL) 20 MG tablet Take 20 mg by mouth every morning.   Yes Historical Provider, MD  Melatonin 5 MG TABS Take 1 tablet by mouth at bedtime.   Yes Historical Provider, MD  mirtazapine (REMERON) 45 MG tablet Take 22.5 mg by mouth at bedtime.   Yes Historical Provider, MD  OLANZapine (ZYPREXA) 2.5 MG tablet Take 2.5 mg by mouth at bedtime.   Yes Historical Provider, MD  sertraline (ZOLOFT) 50 MG tablet Take 50 mg by mouth daily.   Yes Historical Provider, MD  traZODone (DESYREL) 50 MG tablet Take 50 mg by mouth at bedtime.   Yes Historical Provider, MD  OLANZapine (ZYPREXA) 5 MG tablet Take 5 mg by mouth 2 (two) times daily.    Historical Provider, MD   No Known Allergies   FAMILY HISTORY   History reviewed. No pertinent family history.    SOCIAL HISTORY    reports that he has never smoked. He does not have any smokeless tobacco history on file. He reports that he does not drink alcohol or use illicit drugs.  Review of Systems  Unable to perform ROS: critical illness      VITAL SIGNS    Temp:   [95.2 F (35.1 C)-97.6 F (36.4 C)] 95.2 F (35.1 C) (05/23 0500) Pulse Rate:  [39-63] 63 (05/23 0900) Resp:  [16-25] 25 (05/23 0900) BP: (129-178)/(62-95) 150/84 mmHg (05/23 0900) SpO2:  [94 %-100 %] 98 % (05/23 0900) FiO2 (%):  [50 %] 50 % (05/23 0845) Weight:  [225 lb 12 oz (102.4 kg)] 225 lb 12 oz (102.4 kg) (05/23 0500) HEMODYNAMICS:   VENTILATOR SETTINGS: Vent Mode:  [-]  FiO2 (%):  [50 %] 50 % INTAKE / OUTPUT:  Intake/Output Summary (Last 24 hours) at 08/24/14 0931 Last data filed at 08/24/14 0720  Gross per 24 hour  Intake 3089.02 ml  Output    950 ml  Net 2139.02 ml       PHYSICAL EXAM   Physical Exam  Constitutional: He appears distressed.  HENT:  Head: Normocephalic and atraumatic.  Eyes: Pupils are equal, round, and reactive to light. No scleral icterus.  Neck: Normal range of motion. Neck supple.  Cardiovascular: Normal rate and regular rhythm.   No murmur heard. Pulmonary/Chest: He is in respiratory distress. He has no wheezes. He has rales.  resp distress  Abdominal: Soft. He exhibits distension. There is no tenderness.  t-tube in place  Musculoskeletal: He exhibits no edema.  Neurological: He displays normal reflexes. Coordination normal.  lethargic  Skin: Skin is warm. No rash noted. He is diaphoretic.       LABS   LABS:  CBC  Recent Labs Lab 08/20/14 0527 08/22/14 0423 08/23/14 0736 08/24/14 0717  WBC 4.3  --  10.6 13.4*  HGB 12.0*  --  13.6 14.1  HCT 36.5*  --  41.7 43.7  PLT 169 294 256 286   Coag's  Recent Labs Lab 08/20/14 1106  INR 1.01   BMET  Recent Labs Lab 08/22/14 0423 08/23/14 0736 08/24/14 0717  NA 143 142 143  K 3.1* 3.9 3.5  CL 105 105 108  CO2 26 24 24   BUN 23* 21* 21*  CREATININE 0.99 0.93 0.76  GLUCOSE 82 106* 120*   Electrolytes  Recent Labs Lab 08/20/14 1106 08/21/14 0641 08/22/14 0423 08/23/14 0736 08/24/14 0717 08/24/14 0719  CALCIUM  --  7.8* 8.0* 7.9* 7.9*  --   MG 1.8 2.3 2.2   --   --  2.3  PHOS 2.2* 3.9 3.0  --   --   --    Sepsis Markers No results for input(s): LATICACIDVEN, PROCALCITON, O2SATVEN in the last 168 hours. ABG  Recent Labs Lab 08/22/14 2005 08/24/14 0510 08/24/14 0830  PHART 7.49* 7.39 7.39  PCO2ART 36 38 40  PO2ART 120* 82* 69*   Liver Enzymes  Recent Labs Lab 08/12/2014 0511 08/20/14 0527 08/21/14 0641  AST 60* 39 32  ALT 75* 57 55  ALKPHOS 98 88 92  BILITOT 5.8* 4.3* 2.4*  ALBUMIN 3.1* 2.6* 2.9*   Cardiac Enzymes No results for input(s): TROPONINI, PROBNP in the last 168 hours. Glucose  Recent Labs Lab 08/02/2014 1841  GLUCAP 156*     Recent Results (from the past 240 hour(s))  Culture, blood (routine x 2)     Status: None (Preliminary result)   Collection Time: 08/14/2014  6:50 PM  Result Value Ref Range Status   Specimen Description BLOOD  Final   Special Requests NONE  Final   Culture NO GROWTH 4 DAYS  Final   Report Status PENDING  Incomplete  Culture, blood (routine x 2)     Status: None (Preliminary result)   Collection Time: 08/30/2014  7:00 PM  Result Value Ref Range Status   Specimen Description BLOOD  Final   Special Requests NONE  Final   Culture NO GROWTH 4 DAYS  Final   Report Status PENDING  Incomplete  Body fluid culture     Status: None (Preliminary result)   Collection Time: 08/20/14 11:54 AM  Result Value Ref Range Status   Specimen Description BILE  Final   Special Requests NONE  Final   Gram Stain FEW WBC SEEN NO ORGANISMS SEEN   Final   Culture No growth in 36 hours  Final   Report Status PENDING  Incomplete  Anaerobic culture     Status: None (Preliminary result)   Collection Time: 08/20/14 11:54 AM  Result Value Ref Range Status   Specimen Description GALL  Final   Special Requests Normal  Final   Culture NO ANAEROBES ISOLATED  Final   Report Status PENDING  Incomplete      IMAGING    No results found.    Indwelling Urinary Catheter continued, requirement due to   Reason  to continue Indwelling Urinary Catheter for strict Intake/Output monitoring for hemodynamic instability                ASSESSMENT/PLAN    69 yo white male  with post op hypoxic  resp failure likely  from acute hypoventilation with severe sepsis from acute cholecystitis  PULMONARY-resp failure-plan for intubation- -now on bIPAP, fio2 at 50%, patient with increased WOB and SOB   CARDIOVASCULAR -Needs ICU montioring -vasopressors as needed for septic shock  RENAL -watch UO -continue foley catheter  GASTROINTESTINAL S/p ERCP S/p  percutaneous GB drain   HEMATOLOGIC -watch h/h  INFECTIOUS -on iv unasyn for cholecystitis  ENDOCRINE -watch FSBS  NEUROLOGIC -encephalopathy unknown etiology -precedex and ativan    I have personally obtained a history, examined the patient, evaluated laboratory and imaging results, formulated the assessment and plan and placed orders.  The Patient requires high complexity decision making for assessment and support, frequent evaluation and titration of therapies, application of advanced monitoring technologies and extensive interpretation of multiple databases. Critical Care Time devoted to patient care services described in this note is 40 minutes.   Overall, patient is critically ill, prognosis is guarded. Patient at high risk for re-intubation and cardiac arrest.   Lucie Leather, M.D. Pulmonary & Critical Care Medicine [facility] Medical Director Intensive Care Unit   08/24/2014, 9:31 AM

## 2014-08-24 NOTE — Progress Notes (Signed)
PHARMACY - CRITICAL CARE PROGRESS NOTE  Pharmacy Consult for Electrolyte Management   Indication: ICU Status   No Known Allergies  Patient Measurements: Height: 5\' 11"  (180.3 cm) Weight: 225 lb 12 oz (102.4 kg) IBW/kg (Calculated) : 75.3   Vital Signs: Temp: 97.8 F (36.6 C) (05/23 1900) Temp Source: Oral (05/23 1900) BP: 95/70 mmHg (05/23 1900) Pulse Rate: 84 (05/23 1900) Intake/Output from previous day: 05/22 0701 - 05/23 0700 In: 2839 [I.V.:2039; IV Piggyback:800] Out: 950 [Urine:925; Drains:25] Intake/Output from this shift:   Vent settings for last 24 hours: Vent Mode:  [-] PRVC FiO2 (%):  [40 %-50 %] 40 % Set Rate:  [16 bmp] 16 bmp Vt Set:  [550 mL] 550 mL PEEP:  [5 cmH20] 5 cmH20  Labs:  Recent Labs  08/22/14 0423 08/23/14 0736 08/24/14 0717 08/24/14 0719  WBC  --  10.6 13.4*  --   HGB  --  13.6 14.1  --   HCT  --  41.7 43.7  --   PLT 294 256 286  --   CREATININE 0.99 0.93 0.76  --   MG 2.2  --   --  2.3  PHOS 3.0  --   --   --    Estimated Creatinine Clearance: 106.1 mL/min (by C-G formula based on Cr of 0.76).   Recent Labs  08/24/14 1635  GLUCAP 102*    Microbiology: Recent Results (from the past 720 hour(s))  Culture, blood (routine x 2)     Status: None (Preliminary result)   Collection Time: 11-22-14  6:50 PM  Result Value Ref Range Status   Specimen Description BLOOD  Final   Special Requests NONE  Final   Culture NO GROWTH 4 DAYS  Final   Report Status PENDING  Incomplete  Culture, blood (routine x 2)     Status: None (Preliminary result)   Collection Time: 11-22-14  7:00 PM  Result Value Ref Range Status   Specimen Description BLOOD  Final   Special Requests NONE  Final   Culture NO GROWTH 4 DAYS  Final   Report Status PENDING  Incomplete  Body fluid culture     Status: None   Collection Time: 08/20/14 11:54 AM  Result Value Ref Range Status   Specimen Description BILE  Final   Special Requests NONE  Final   Gram Stain FEW  WBC SEEN NO ORGANISMS SEEN   Final   Culture NO GROWTH 3 DAYS  Final   Report Status 08/24/2014 FINAL  Final  Anaerobic culture     Status: None   Collection Time: 08/20/14 11:54 AM  Result Value Ref Range Status   Specimen Description GALL  Final   Special Requests Normal  Final   Culture NO ANAEROBES ISOLATED  Final   Report Status 08/24/2014 FINAL  Final  MRSA PCR Screening     Status: None   Collection Time: 08/24/14  4:01 PM  Result Value Ref Range Status   MRSA by PCR NEGATIVE NEGATIVE Final    Comment:        The GeneXpert MRSA Assay (FDA approved for NASAL specimens only), is one component of a comprehensive MRSA colonization surveillance program. It is not intended to diagnose MRSA infection nor to guide or monitor treatment for MRSA infections.     Medications:  Scheduled:  . ampicillin-sulbactam (UNASYN) IV  3 g Intravenous Q6H  . budesonide (PULMICORT) nebulizer solution  0.5 mg Nebulization BID  . chlorhexidine  15 mL Mouth/Throat BID  .  docusate  100 mg Oral BID  . famotidine (PEPCID) IV  20 mg Intravenous Q12H  . feeding supplement (VITAL HIGH PROTEIN)  1,000 mL Per Tube Q24H  . free water  25 mL Per Tube Q4H  . gabapentin  200 mg Oral BID  . ipratropium-albuterol  3 mL Nebulization Q4H  . mirtazapine  22.5 mg Oral QHS  . OLANZapine  2.5 mg Oral QHS  . QUEtiapine  12.5 mg Oral QHS  . sennosides  5 mL Oral BID  . sertraline  50 mg Oral Daily  . traZODone  50 mg Oral QHS   Infusions:  . sodium chloride 100 mL/hr at 08/24/14 1900  . dexmedetomidine 1.002 mcg/kg/hr (08/24/14 1900)  . fentaNYL infusion INTRAVENOUS 200 mcg/hr (08/24/14 1900)     Assessment: Pharmacy consulted to monitor and adjust electrolytes in this 69yo M admitted for acute cholecystitis s/p ERCP and possible percutaneous drainage. Patient was extubated on 5/20.   Plan:  1. Antibiotics - Unasyn 3gm IV Q6hrs Day 9 for acute cholecystitis. This is appropriate for renal function.  Patient received 4 days of vancomycin (5/19-5/22).   2. Electrolytes -  WNL limits today. Will recheck electrolytes with am labs.   3. Constipation - Patient sedated on fentanyl. Last documented BM on 5/22. Will initiate senna 5mL VT BID and docusate  VT BID.    Pharmacy will continue to monitor and adjust per consult.    Alex Allison,Alex Allison 08/24/2014,9:18 PM

## 2014-08-24 NOTE — Progress Notes (Signed)
Pt reintubated today due to labored breathing and continued dec. LOC.  No problem during intubation.  Kasa performed same as well as Bronch and insertion of LIJ triple lumen.  NG placed with no complications.  Pt remains sedated and appears calm overall but will awaken suddenly and become agitated in the bed.  Pt given extra sedation at times to help with same.    NSR.

## 2014-08-24 NOTE — Procedures (Signed)
Central Venous Catheter Placement: Indication: Patient receiving vesicant or irritant drug.; Patient receiving intravenous therapy for longer than 5 days.; Patient has limited or no vascular access.   Consent:verbal  Risks and benefits explained in detail including risk of infection, bleeding, respiratory failure and death..   Hand washing performed prior to starting the procedure.   Procedure: An active timeout was performed and correct patient, name, & ID confirmed.  After explaining risk and benefits, patient was positioned correctly for central venous access. Patient was prepped using strict sterile technique including chlorohexadine preps, sterile drape, sterile gown and sterile gloves.  The area was prepped, draped and anesthetized in the usual sterile manner. Patient comfort was obtained.  A triple lumen catheter was placed in Left Internal Jugular Vein There was good blood return, catheter caps were placed on lumens, catheter flushed easily, the line was secured and a sterile dressing and bio-patch applied.   Ultrasound Sonosite was used to visualize vasculature and guidance of needle.   Number of Attempts: 1 Complications:none Estimated Blood Loss:none  Chest Radiograph indicated and ordered.   Operator: Wilhelm Ganaway.   Lucie LeatherKurian David Jordanny Waddington, M.D. Pulmonary & Critical Care Medicine [facility] Director Intensive Care Unit

## 2014-08-24 NOTE — Procedures (Signed)
Endotracheal Intubation: Patient required placement of an artificial airway secondary to resp failure.   Consent: Emergent.   Hand washing performed prior to starting the procedure.   Medications administered for sedation prior to procedure: Midazolam 4 mg IV,  Vecuronium 10 mg IV, Fentanyl 100 mcg IV.   Procedure: A time out procedure was called and correct patient, name, & ID confirmed. Needed supplies and equipment were assembled and checked to include ETT, 10 ml syringe, Glidescope, Mac and Miller blades, suction, oxygen and bag mask valve, end tidal CO2 monitor. Patient was positioned to align the mouth and pharynx to facilitate visualization of the glottis.  Heart rate, SpO2 and blood pressure was continuously monitored during the procedure. Pre-oxygenation was conducted prior to intubation and endotracheal tube was placed through the vocal cords into the trachea.  During intubation an assistant applied gentle pressure to the cricoid cartilage.   The artificial airway was placed under direct visualization via glidescope route using a 8.5 ETT on the first attempt.    ETT was secured at 23 cm mark.    Placement was confirmed by auscuitation of lungs with good breath sounds bilaterally and no stomach sounds.  Condensation was noted on endotracheal tube.  Pulse ox 100%.  CO2 detector in place with appropriate color change.   Complications: None .   Operator: Johari Pinney.   Chest radiograph ordered and pending.    Lucie LeatherKurian David Valary Manahan, M.D. Pulmonary & Critical Care Medicine [facility] Medical Director Intensive Care Unit

## 2014-08-24 NOTE — Care Management (Signed)
Patient was placed on bipapp over the weekend.  He has become increasingly agitated ande confused.  Placed on Precedex drip and is currently being intubated

## 2014-08-24 NOTE — Progress Notes (Signed)
[facility] PRO[facility]  Alex Allison WUJ:[id number] DOB: 02/21/1946 DOA: 08/31/2014 PCP: Corky DownsMASOUD,JAVED, MD  HPI/Subjective: Patient overnight had some respiratory difficulties, with tachypnea. He continues to be on BiPAP, and is more tachypneic today. Objective: Filed Vitals:   08/24/14 1100  BP: 143/91  Pulse: 58  Temp:   Resp: 16    Intake/Output Summary (Last 24 hours) at 08/24/14 1144 Last data filed at 08/24/14 0720  Gross per 24 hour  Intake 3089.02 ml  Output    950 ml  Net 2139.02 ml   Filed Weights   08/21/14 0500 08/22/14 0540 08/24/14 0500  Weight: 103.7 kg (228 lb 9.9 oz) 105 kg (231 lb 7.7 oz) 102.4 kg (225 lb 12 oz)    ROS: Review of Systems   unable to give any review of systems due to his mental status   Exam: Physical Exam  General: Critically ill-appearing male , BiPAP  HENT:  Nose: No mucosal edema.  Eyes: Conjunctivae and lids are normal. Pupils are equal, round, and reactive to light.  Neck: No JVD present. Carotid bruit is not present. No edema present. No thyroid mass and no thyromegaly present.  Cardiovascular: Regular rhythm, S1 normal and S2 normal.  Exam reveals no gallop.   No murmur heard. Pulses:      Dorsalis pedis pulses are 1+ on the right side, and 1+ on the left side.  Respiratory: No respiratory distress. He has decreased breath sounds in the right lower field and the left lower field. He has no wheezes. He has rhonchi in the right lower field. He has no rales.  GI: Soft. Bowel sounds are normal. There is no hepatosplenomegaly. There is no tenderness.  Musculoskeletal:       Right ankle: He exhibits swelling.       Left ankle: He exhibits swelling.  Lymphadenopathy:    He has no cervical adenopathy.  Neurological: Decreased responsiveness.  Skin: Skin is warm. No rash noted. Nails show no clubbing.  Psychiatric: Decreased responsive Data Reviewed: Basic Metabolic Panel:  Recent Labs Lab 08/20/14 0527  08/20/14 1106 08/21/14 0641 08/22/14 0423 08/23/14 0736 08/24/14 0717 08/24/14 0719  NA 139  --  141 143 142 143  --   K 3.4*  --  3.7 3.1* 3.9 3.5  --   CL 107  --  107 105 105 108  --   CO2 26  --  27 26 24 24   --   GLUCOSE 204*  --  124* 82 106* 120*  --   BUN 14  --  20 23* 21* 21*  --   CREATININE 0.72  --  0.97 0.99 0.93 0.76  --   CALCIUM 7.6*  --  7.8* 8.0* 7.9* 7.9*  --   MG  --  1.8 2.3 2.2  --   --  2.3  PHOS  --  2.2* 3.9 3.0  --   --   --    Liver Function Tests:  Recent Labs Lab 08/18/14 0717 2014-07-03 0511 08/20/14 0527 08/21/14 0641  AST 63* 60* 39 32  ALT 84* 75* 57 55  ALKPHOS 103 98 88 92  BILITOT 5.5* 5.8* 4.3* 2.4*  PROT 6.3* 5.6* 5.4* 5.9*  ALBUMIN 3.4* 3.1* 2.6* 2.9*    Recent Labs Lab 08/20/14 0526  LIPASE 32   CBC:  Recent Labs Lab 08/18/14 0717 2014-07-03 0511 08/20/14 0527 08/22/14 0423 08/23/14 0736 08/24/14 0717  WBC 6.4  --  4.3  --  10.6  13.4*  HGB 13.2  --  12.0*  --  13.6 14.1  HCT 40.1  --  36.5*  --  41.7 43.7  MCV 93.4  --  94.1  --  93.0 93.9  PLT 185 180 169 294 256 286     Recent Results (from the past 240 hour(s))  Culture, blood (routine x 2)     Status: None (Preliminary result)   Collection Time: 31-Aug-2014  6:50 PM  Result Value Ref Range Status   Specimen Description BLOOD  Final   Special Requests NONE  Final   Culture NO GROWTH 4 DAYS  Final   Report Status PENDING  Incomplete  Culture, blood (routine x 2)     Status: None (Preliminary result)   Collection Time: Aug 31, 2014  7:00 PM  Result Value Ref Range Status   Specimen Description BLOOD  Final   Special Requests NONE  Final   Culture NO GROWTH 4 DAYS  Final   Report Status PENDING  Incomplete  Body fluid culture     Status: None   Collection Time: 08/20/14 11:54 AM  Result Value Ref Range Status   Specimen Description BILE  Final   Special Requests NONE  Final   Gram Stain FEW WBC SEEN NO ORGANISMS SEEN   Final   Culture NO GROWTH 3 DAYS  Final    Report Status 08/24/2014 FINAL  Final  Anaerobic culture     Status: None   Collection Time: 08/20/14 11:54 AM  Result Value Ref Range Status   Specimen Description GALL  Final   Special Requests Normal  Final   Culture NO ANAEROBES ISOLATED  Final   Report Status 08/24/2014 FINAL  Final     Studies:   Scheduled Meds: . ampicillin-sulbactam (UNASYN) IV  3 g Intravenous Q6H  . budesonide (PULMICORT) nebulizer solution  0.5 mg Nebulization BID  . chlorhexidine  15 mL Mouth/Throat BID  . famotidine (PEPCID) IV  20 mg Intravenous Q12H  . gabapentin  200 mg Oral BID  . ipratropium-albuterol  3 mL Nebulization Q4H  . mirtazapine  22.5 mg Oral QHS  . OLANZapine  2.5 mg Oral QHS  . QUEtiapine  12.5 mg Oral QHS  . sertraline  50 mg Oral Daily  . traZODone  50 mg Oral QHS  . vancomycin  1,250 mg Intravenous Q12H    Assessment/Plan:  1. Acute respiratory failure with hypoxia; patient was reintubated after ERCP. Extubated on 08/21/2014. CT scan of the chest shows bilateral pneumonia as well as effusions. Patient respiratory status seems to be deteriorating. I have discussed with Dr. Belia Heman we will plan to intubate the patient and likely will need a bronchoscopy as well. 2. Acute cholecystitis with Choledocholelithiasis; patient is status post ERCP with sphincterotomy and stones extracted. Patient is also status post interventional radiology drainage of gallbladder. Patient will likely go home with the gallbladder drain. Surgical team following. IV Unasyn. Surgery to consider outpatient cholecystectomy. 3. Elevated liver function tests; this is secondary to the acute cholecystitis and choledochollithiasis. Continue to monitor 4. Polio, left sided weakness.  Unable to ambulate at this point. Has always used a brace for his left leg. 5. Back pain: Seen by Ortho Evra recommend course set for the back pain has lumbar fractures which needs to medically be managed   Code Status:     Code Status  Orders        Start     Ordered   08/14/2014 1430  Full code  Continuous     08/12/2014 1433    Advance Directive Documentation        Most Recent Value   Type of Advance Directive  Healthcare Power of Attorney, Living will   Pre-existing out of facility DNR order (yellow form or pink MOST form)     "MOST" Form in Place?       Family Communication: Patient's wife at the bedsideexplained to her the plan of care  Time spent:  55 minuteses Critical care time patient at high risk of cardiopulmonary arrest.  Allena Katz [facility]  [facility]icians PROGRESS NOTE  Alex Allison ZOX:[id number] DOB: 01/21/1946 DOA: 08/02/2014 PCP: Corky Downs, MD  HPI/Subjective: Patient feeling cold right now. Shaking. Does not complain of shortness of breath. No cough. Some abdominal discomfort.  Objective: Filed Vitals:   08/24/14 1100  BP: 143/91  Pulse: 58  Temp:   Resp: 16    Intake/Output Summary (Last 24 hours) at 08/24/14 1144 Last data filed at 08/24/14 0720  Gross per 24 hour  Intake 3089.02 ml  Output    950 ml  Net 2139.02 ml   Filed Weights   08/21/14 0500 08/22/14 0540 08/24/14 0500  Weight: 103.7 kg (228 lb 9.9 oz) 105 kg (231 lb 7.7 oz) 102.4 kg (225 lb 12 oz)    ROS: Review of Systems  Constitutional: Negative for fever and chills.  Eyes: Negative for blurred vision.  Respiratory: Negative for cough and shortness of breath.   Cardiovascular: Negative for chest pain.  Gastrointestinal: Positive for abdominal pain. Negative for nausea, vomiting, diarrhea and constipation.  Genitourinary: Negative for dysuria.  Musculoskeletal: Negative for joint pain.  Neurological: Negative for dizziness and headaches.    Exam: Physical Exam  HENT:  Nose: No mucosal edema.  Eyes: Conjunctivae and lids are normal. Pupils are equal, round, and reactive to light.  Neck: No JVD present. Carotid bruit is not present. No edema present.  No thyroid mass and no thyromegaly present.  Cardiovascular: Regular rhythm, S1 normal and S2 normal.  Exam reveals no gallop.   No murmur heard. Pulses:      Dorsalis pedis pulses are 1+ on the right side, and 1+ on the left side.  Respiratory: No respiratory distress. He has decreased breath sounds in the right lower field and the left lower field. He has no wheezes. He has rhonchi in the right lower field. He has no rales.  GI: Soft. Bowel sounds are normal. There is no hepatosplenomegaly. There is no tenderness.  Musculoskeletal:       Right ankle: He exhibits swelling.       Left ankle: He exhibits swelling.  Lymphadenopathy:    He has no cervical adenopathy.  Neurological: He is alert.  Skin: Skin is warm. No rash noted. Nails show no clubbing.  Psychiatric: He has a normal mood and affect.    Data Reviewed: Basic Metabolic Panel:  Recent Labs Lab 08/20/14 0527 08/20/14 1106 08/21/14 0641 08/22/14 0423 08/23/14 0736 08/24/14 0717 08/24/14 0719  NA 139  --  141 143 142 143  --   K 3.4*  --  3.7 3.1* 3.9 3.5  --   CL 107  --  107 105 105 108  --   CO2 26  --  --   GLUCOSE 204*  --  124* 82 106* 120*  --   BUN 14  --  20 23* 21* 21*  --   CREATININE 0.72  --  0.97 0.99 0.93 0.76  --   CALCIUM 7.6*  --  7.8* 8.0* 7.9* 7.9*  --   MG  --  1.8 2.3 2.2  --   --  2.3  PHOS  --  2.2* 3.9 3.0  --   --   --    Liver Function Tests:  Recent Labs Lab 08/18/14 0717 08/18/2014 0511 08/20/14 0527 08/21/14 0641  AST 63* 60* 39 32  ALT 84* 75* 57 55  ALKPHOS 103 98 88 92  BILITOT 5.5* 5.8* 4.3* 2.4*  PROT 6.3* 5.6* 5.4* 5.9*  ALBUMIN 3.4* 3.1* 2.6* 2.9*    Recent Labs Lab 08/20/14 0526  LIPASE 32   CBC:  Recent Labs Lab 08/18/14 0717 08/09/2014 0511 08/20/14 0527 08/22/14 0423 08/23/14 0736 08/24/14 0717  WBC 6.4  --  4.3  --  10.6 13.4*  HGB 13.2  --  12.0*  --  13.6 14.1  HCT 40.1  --  36.5*  --  41.7 43.7  MCV 93.4  --  94.1  --  93.0 93.9   PLT 185 180 169 294 256 286     Recent Results (from the past 240 hour(s))  Culture, blood (routine x 2)     Status: None (Preliminary result)   Collection Time: 08/11/2014  6:50 PM  Result Value Ref Range Status   Specimen Description BLOOD  Final   Special Requests NONE  Final   Culture NO GROWTH 4 DAYS  Final   Report Status PENDING  Incomplete  Culture, blood (routine x 2)     Status: None (Preliminary result)   Collection Time: 08/17/2014  7:00 PM  Result Value Ref Range Status   Specimen Description BLOOD  Final   Special Requests NONE  Final   Culture NO GROWTH 4 DAYS  Final   Report Status PENDING  Incomplete  Body fluid culture     Status: None   Collection Time: 08/20/14 11:54 AM  Result Value Ref Range Status   Specimen Description BILE  Final   Special Requests NONE  Final   Gram Stain FEW WBC SEEN NO ORGANISMS SEEN   Final   Culture NO GROWTH 3 DAYS  Final   Report Status 08/24/2014 FINAL  Final  Anaerobic culture     Status: None   Collection Time: 08/20/14 11:54 AM  Result Value Ref Range Status   Specimen Description GALL  Final   Special Requests Normal  Final   Culture NO ANAEROBES ISOLATED  Final   Report Status 08/24/2014 FINAL  Final     Studies:   Scheduled Meds: . ampicillin-sulbactam (UNASYN) IV  3 g Intravenous Q6H  . budesonide (PULMICORT) nebulizer solution  0.5 mg Nebulization BID  . chlorhexidine  15 mL Mouth/Throat BID  . famotidine (PEPCID) IV  20 mg Intravenous Q12H  . gabapentin  200 mg Oral BID  . ipratropium-albuterol  3 mL Nebulization Q4H  . mirtazapine  22.5 mg Oral QHS  . OLANZapine  2.5 mg Oral QHS  . QUEtiapine  12.5 mg Oral QHS  . sertraline  50 mg Oral Daily  . traZODone  50 mg Oral QHS  . vancomycin  1,250 mg Intravenous Q12H    Assessment/Plan:  6. Acute respiratory failure with hypoxia; patient was reintubated after ERCP. Extubated on 08/21/2014. Currently on 4.5 L of oxygen nasal cannula. Pulse ox wave is not that  good and pulse ox  is very variable. Continue to monitor overnight in the ICU. We will give incentive spirometry. On IV Lasix daily. 7. Acute cholecystitis with Choledocholelithiasis; patient is status post ERCP with sphincterotomy and stones extracted. Patient is also status post interventional radiology drainage of gallbladder. Patient will likely go home with the gallbladder drain. Surgical team following. IV Unasyn. Surgery to consider outpatient cholecystectomy. 8. Elevated liver function tests; this is secondary to the acute cholecystitis and choledochollithiasis. Bilirubin trending better today. 9. Polio, left sided weakness.  Unable to ambulate at this point. Has always used a brace for his left leg.   Code Status:     Code Status Orders        Start     Ordered   08/31/2014 1430  Full code   Continuous     08/08/2014 1433    Advance Directive Documentation        Most Recent Value   Type of Advance Directive  Healthcare Power of Attorney, Living will   Pre-existing out of facility DNR order (yellow form or pink MOST form)     "MOST" Form in Place?       Family Communication: Patient's wife at the bedside. Disposition Plan: To be determined  Time spent: 25 minutes  Zeph Riebel, [facility]  [facility]

## 2014-08-24 NOTE — Progress Notes (Signed)
Nutrition Follow-up  DOCUMENTATION CODES:     INTERVENTION:   (Coordination of care: Dr Belia HemanKasa wanting to start enteral nutrition at this time after NG tube placed.  Recommend vital high protein at  goal rate  of 640ml/hr with prostat time 4.  Provides 1480 kcals and 155 gm of protein (99% protein needs and 100% kcal n).  Recommend checking mag and phos in am, starting at 2620ml/hr today and reassess in am  NUTRITION DIAGNOSIS:  Inadequate oral intake related to inability to eat as evidenced by NPO status, ongoing.    GOAL:   (Goal would be for pt to tolerate tube feeding at goal rate within 48-72 hr)    MONITOR:   (Energy Intake, Electrolyte and Renal Profile, Pulmonary Profile, Anthropometrics, Digestive system)  REASON FOR ASSESSMENT:   (follow-up)    ASSESSMENT:  Pt intubated this am, planning bronchscopy, acute cholecystitis with perc drain in place Limited intake day 8 (NPO/CL)  Medications: NS at 15400ml/hr, fentanyl, versed  Electrolyte and Renal Profile:    Recent Labs Lab 08/20/14 1106 08/21/14 0641 08/22/14 0423 08/23/14 0736 08/24/14 0717 08/24/14 0719  BUN  --  20 23* 21* 21*  --   CREATININE  --  0.97 0.99 0.93 0.76  --   NA  --  141 143 142 143  --   K  --  3.7 3.1* 3.9 3.5  --   MG 1.8 2.3 2.2  --   --  2.3  PHOS 2.2* 3.9 3.0  --   --   --      Height:  Ht Readings from Last 1 Encounters:  08/20/14 5\' 11"  (1.803 m)    Weight:  Wt Readings from Last 1 Encounters:  08/24/14 225 lb 12 oz (102.4 kg)       Wt Readings from Last 10 Encounters:  08/24/14 225 lb 12 oz (102.4 kg)    BMI:  Body mass index is 31.5 kg/(m^2).  Estimated Nutritional Needs:  Kcal:  11-14 kcals/kg (Using actual wt of 102 kg) [id number] kcals/d  Protein:  (156-195 gm/d (2.0-2.5 gm/kg) Using IBW of 78kg  Fluid:  1950-234140ml/d (25-730ml/kg) Using IBW of 78kg  Skin:  Reviewed, no issues  Diet Order:  Diet clear liquid Room service appropriate?: Yes; Fluid  consistency:: Thin  EDUCATION NEEDS:  No education needs identified at this time   Intake/Output Summary (Last 24 hours) at 08/24/14 1223 Last data filed at 08/24/14 0720  Gross per 24 hour  Intake 3089.02 ml  Output    950 ml  Net 2139.02 ml    Last BM:  5/21  HIGH Care Level Randie Bloodgood B. Freida BusmanAllen, RD, LDN [phone] (pager)

## 2014-08-24 NOTE — Procedures (Signed)
PROCEDURE: BRONCHOSCOPY Therapeutic Aspiration of Tracheobronchial Tree  PROCEDURE DATE: 08/24/2014  TIME: There are other unrelated non-urgent complaints, but due to the busy schedule and the amount of time I've already spent with him, time does not permit me to address these routine issues at today's visit. I've requested another appointment to review these additional issues.   NAME:  Alex Allison  DOB:November 29, 1945  MRN: [id number] LOC:  IC06A/IC06A-AA    HOSP DAY: @LENGTHOFSTAYDAYS @     Code Status Orders        Start     Ordered   08/20/14 1915  Full code   Continuous     08/20/14 1914    Advance Directive Documentation        Most Recent Value   Type of Advance Directive  Healthcare Power of Attorney, Living will   Pre-existing out of facility DNR order (yellow form or pink MOST form)     "MOST" Form in Place?            Indications/Preliminary Diagnosis:   Consent: (Place X beside choice/s below)  The benefits, risks and possible complications of the procedure were        explained to:  ___ patient  _x__ patient's family  ___ other:___________  who verbalized understanding and gave:  ___ verbal  ___ written  _x__ verbal and written  ___ telephone  ___ other:________ consent.      Unable to obtain consent; procedure performed on emergent basis.     Other:       PRESEDATION ASSESSMENT: History and Physical has been performed. Patient meds and allergies have been reviewed. Presedation airway examination has been performed and documented. Baseline vital signs, sedation score, oxygenation status, and cardiac rhythm were reviewed. Patient was deemed to be in satisfactory condition to undergo the procedure.  PREMEDICATIONS: fentanyl infusion  Sedative/Narcotic Amt Dose   Versed  mg   Fentanyl  mcg  Diprivan  mg        Airway Prep (Place X beside choice below)   1% Transtracheal Lidocaine Anesthetization 7 cc   Patient prepped per Bronchoscopy Lab Policy        Insertion Route (Place X beside choice below)   Nasal   Oral  x Endotracheal Tube   Tracheostomy    Medication Amt Dose  Medication Amt Dose  Lidocaine 1%  cc  Epinephrine 1:10,000 sol  cc  Xylocaine 4%  cc  Cocaine  cc   TECHNICAL PROCEDURES: (Place X beside choice below)   Procedures  Description    None     Electrocautery     Cryotherapy     Balloon Dilatation     Bronchography     Stent Placement   x  Therapeutic Aspiration     Laser/Argon Plasma    Brachytherapy Catheter Placement    Foreign Body Removal     PROCEDURE DETAILS: Timeout performed and correct patient, name, & ID confirmed. Following prep per Pulmonary policy, appropriate sedation was administered. The Bronchoscope was inserted in to oral cavity with bite block in place. Therapeutic aspiration of Tracheobronchial tree was performed.  Airway exam proceeded with findings, technical procedures, and specimen collection as noted below. At the end of exam the scope was withdrawn without incident. Impression and Plan as noted below.       SPECIMENS (Sites): (Place X beside choice below)  Specimens Description   No Specimens Obtained     Washings   x Lavage Extensive amounts of purulent secretions  Biopsies    Fine Needle Aspirates    Brushings    Sputum    FINDINGS: extensive amounts of thick purulant secretions in all segmenst of airways    ESTIMATED BLOOD LOSS: none COMPLICATIONS/RESOLUTION: none      IMPRESSION:POST-PROCEDURE DX: pneumonia    RECOMMENDATION/PLAN: follow up cultures   Lucie Leather, M.D. Pulmonary & Critical Care Medicine [facility] Medical Director Intensive Care Unit

## 2014-08-25 LAB — COMPREHENSIVE METABOLIC PANEL
ALBUMIN: 2.2 g/dL — AB (ref 3.5–5.0)
ALK PHOS: 63 U/L (ref 38–126)
ALT: 38 U/L (ref 17–63)
ANION GAP: 10 (ref 5–15)
AST: 21 U/L (ref 15–41)
BUN: 18 mg/dL (ref 6–20)
CO2: 22 mmol/L (ref 22–32)
Calcium: 7.4 mg/dL — ABNORMAL LOW (ref 8.9–10.3)
Chloride: 114 mmol/L — ABNORMAL HIGH (ref 101–111)
Creatinine, Ser: 0.78 mg/dL (ref 0.61–1.24)
GFR calc Af Amer: >60 mL/min (ref >60)
GFR calc non Af Amer: >60 mL/min (ref >60)
Glucose, Bld: 129 mg/dL — ABNORMAL HIGH (ref 65–99)
Potassium: 3.3 mmol/L — ABNORMAL LOW (ref 3.5–5.1)
SODIUM: 146 mmol/L — AB (ref 135–145)
Total Bilirubin: 1.5 mg/dL — ABNORMAL HIGH (ref 0.3–1.2)
Total Protein: 5.2 g/dL — ABNORMAL LOW (ref 6.5–8.1)

## 2014-08-25 LAB — GLUCOSE, CAPILLARY
GLUCOSE-CAPILLARY: 131 mg/dL — AB (ref 65–99)
GLUCOSE-CAPILLARY: 149 mg/dL — AB (ref 65–99)
Glucose-Capillary: 123 mg/dL — ABNORMAL HIGH (ref 65–99)
Glucose-Capillary: 148 mg/dL — ABNORMAL HIGH (ref 65–99)

## 2014-08-25 LAB — BLOOD GAS, ARTERIAL
Acid-Base Excess: 4.1 mmol/L — ABNORMAL HIGH (ref 0.0–3.0)
Acid-base deficit: 1.8 mmol/L (ref 0.0–2.0)
Allens test (pass/fail): POSITIVE — AB
BICARBONATE: 27.4 meq/L (ref 21.0–28.0)
Bicarbonate: 23.1 mEq/L (ref 21.0–28.0)
FIO2: 80 %
FIO2: 0.4 %
LHR: 16 {breaths}/min
MECHVT: 550 mL
O2 Saturation: 98 %
O2 Saturation: 91.6 %
PCO2 ART: 36 mmHg (ref 32.0–48.0)
PEEP/CPAP: 5 cmH2O
PH ART: 7.38 (ref 7.350–7.450)
Patient temperature: 37
pCO2 arterial: 39 mmHg (ref 32.0–48.0)
pH, Arterial: 7.49 — ABNORMAL HIGH (ref 7.350–7.450)
pO2, Arterial: 120 mmHg — ABNORMAL HIGH (ref 83.0–108.0)
pO2, Arterial: 64 mmHg — ABNORMAL LOW (ref 83.0–108.0)

## 2014-08-25 LAB — CBC
HCT: 35.9 % — ABNORMAL LOW (ref 40.0–52.0)
Hemoglobin: 11.6 g/dL — ABNORMAL LOW (ref 13.0–18.0)
MCH: 30.4 pg (ref 26.0–34.0)
MCHC: 32.3 g/dL (ref 32.0–36.0)
MCV: 94.3 fL (ref 80.0–100.0)
Platelets: 242 10*3/uL (ref 150–440)
RBC: 3.81 MIL/uL — ABNORMAL LOW (ref 4.40–5.90)
RDW: 14.6 % — ABNORMAL HIGH (ref 11.5–14.5)
WBC: 7.9 10*3/uL (ref 3.8–10.6)

## 2014-08-25 LAB — CULTURE, BLOOD (ROUTINE X 2)
CULTURE: NO GROWTH
Culture: NO GROWTH

## 2014-08-25 LAB — MAGNESIUM: Magnesium: 2.3 mg/dL (ref 1.7–2.4)

## 2014-08-25 LAB — PHOSPHORUS: Phosphorus: 3 mg/dL (ref 2.5–4.6)

## 2014-08-25 MED ORDER — POTASSIUM CHLORIDE 20 MEQ PO PACK
40.0000 meq | PACK | Freq: Once | ORAL | Status: AC
  Administered 2014-08-25: 40 meq
  Filled 2014-08-25: qty 2

## 2014-08-25 MED ORDER — SENNOSIDES 8.8 MG/5ML PO SYRP
10.0000 mL | ORAL_SOLUTION | Freq: Two times a day (BID) | ORAL | Status: DC
  Administered 2014-08-25 – 2014-08-28 (×6): 10 mL via ORAL
  Filled 2014-08-25 (×6): qty 10

## 2014-08-25 MED ORDER — PRO-STAT SUGAR FREE PO LIQD
30.0000 mL | Freq: Four times a day (QID) | ORAL | Status: DC
  Administered 2014-08-25 – 2014-08-27 (×8): 30 mL

## 2014-08-25 MED ORDER — VITAL HIGH PROTEIN PO LIQD
1000.0000 mL | ORAL | Status: DC
  Administered 2014-08-25: 1000 mL
  Administered 2014-08-26: 08:00:00
  Administered 2014-08-26: 1000 mL
  Administered 2014-08-26: 07:00:00
  Administered 2014-08-27 – 2014-08-28 (×2): 1000 mL

## 2014-08-25 NOTE — Progress Notes (Signed)
Nutrition Follow-up  DOCUMENTATION CODES:     INTERVENTION:   (Coordination of care: recommend increasing vital high protein to goal rate of 3440ml/hr and prostat times 4 to meet 100% kcal needs and 99% protein needs.). Dr Belia HemanKasa planning on discontinuing NS at 12100ml/hr today, Na elevated.   NUTRITION DIAGNOSIS:  Inadequate oral intake related to inability to eat as evidenced by NPO status, being addressed with tube feeding.    GOAL:   (Goal would be for pt to tolerate tube feeding at goal rate within 48 hr)    MONITOR:   (Energy Intake, Electrolyte and Renal Profile, Pulmonary Profile, Anthropometrics, Digestive system)  REASON FOR ASSESSMENT:   (follow-up)    ASSESSMENT:  Pt remains on vent , s/p bronch, pneumonia  Medications: precedex, fentanyl  Electrolyte and Renal Profile:    Recent Labs Lab 08/21/14 0641 08/22/14 0423 08/23/14 0736 08/24/14 0717 08/24/14 0719 08/25/14 0555  BUN 20 23* 21* 21*  --  18  CREATININE 0.97 0.99 0.93 0.76  --  0.78  NA 141 143 142 143  --  146*  K 3.7 3.1* 3.9 3.5  --  3.3*  MG 2.3 2.2  --   --  2.3 2.3  PHOS 3.9 3.0  --   --   --  3.0     Height:  Ht Readings from Last 1 Encounters:  08/20/14 5\' 11"  (1.803 m)    Weight:  Wt Readings from Last 1 Encounters:  08/25/14 231 lb 14.8 oz (105.2 kg)       Wt Readings from Last 10 Encounters:  08/25/14 231 lb 14.8 oz (105.2 kg)    BMI:  Body mass index is 32.36 kg/(m^2).  Estimated Nutritional Needs:  Kcal:  11-14 kcals/kg (Using actual wt of 102 kg) [id number] kcals/d  Protein:  (156-195 gm/d (2.0-2.5 gm/kg) Using IBW of 78kg  Fluid:  1950-23640ml/d (25-1930ml/kg) Using IBW of 78kg  Skin:  Reviewed, no issues  Diet Order:     EDUCATION NEEDS:  No education needs identified at this time   Intake/Output Summary (Last 24 hours) at 08/25/14 1037 Last data filed at 08/25/14 1000  Gross per 24 hour  Intake 4963.76 ml  Output    650 ml  Net 4313.76 ml     Last BM:  5/21  HIGH Care Level Rodger Giangregorio B. Freida BusmanAllen, RD, LDN [phone] (pager)

## 2014-08-25 NOTE — Progress Notes (Signed)
[facility]  Dyke Bracketthomas W Orlick ZOX:[id number] DOB: 1945-11-03 DOA: 08/08/2014 PCP: Corky DownsMASOUD,JAVED, MD  HPI/Subjective: Patient was intubated yesterday. Currently on the ventilator FiO2 of 40%. Had a bronchial yesterday results are currently pending he had a lot of secretions.    Objective: Filed Vitals:   08/25/14 1400  BP: 91/59  Pulse: 66  Temp:   Resp: 16    Intake/Output Summary (Last 24 hours) at 08/25/14 1458 Last data filed at 08/25/14 1400  Gross per 24 hour  Intake 3977.14 ml  Output    650 ml  Net 3327.14 ml   Filed Weights   08/22/14 0540 08/24/14 0500 08/25/14 0601  Weight: 105 kg (231 lb 7.7 oz) 102.4 kg (225 lb 12 oz) 105.2 kg (231 lb 14.8 oz)    ROS: Review of Systems   unable to give any review of systems due intubation   Exam: Physical Exam  General: Critically ill-appearing male ,  HENT:  Nose: No mucosal edema.  Eyes: Conjunctivae and lids are normal. Pupils are equal, round, and reactive to light.  Neck: No JVD present. Carotid bruit is not present. No edema present. No thyroid mass and no thyromegaly present.  Cardiovascular: Regular rhythm, S1 normal and S2 normal.  Exam reveals no gallop.   No murmur heard. Pulses:      Dorsalis pedis pulses are 1+ on the right side, and 1+ on the left side.  Respiratory: Currently on ventilator decreased breath sounds bilaterally .  GI: Soft. Bowel sounds are normal. There is no hepatosplenomegaly. There is no tenderness.  Musculoskeletal:       Right ankle: He exhibits swelling.       Left ankle: He exhibits swelling.  Lymphadenopathy:    He has no cervical adenopathy.  Neurological: Decreased responsiveness.  Skin: Skin is warm. No rash noted. Nails show no clubbing.  Psychiatric: Decreased responsive Data Reviewed: Basic Metabolic Panel:  Recent Labs Lab 08/20/14 1106 08/21/14 0641 08/22/14 0423 08/23/14 0736 08/24/14 0717 08/24/14 0719 08/25/14 0555  NA  --  141 143  142 143  --  146*  K  --  3.7 3.1* 3.9 3.5  --  3.3*  CL  --  107 105 105 108  --  114*  CO2  --  27 26 24 24   --  22  GLUCOSE  --  124* 82 106* 120*  --  129*  BUN  --  20 23* 21* 21*  --  18  CREATININE  --  0.97 0.99 0.93 0.76  --  0.78  CALCIUM  --  7.8* 8.0* 7.9* 7.9*  --  7.4*  MG 1.8 2.3 2.2  --   --  2.3 2.3  PHOS 2.2* 3.9 3.0  --   --   --  3.0   Liver Function Tests:  Recent Labs Lab 08/29/2014 0511 08/20/14 0527 08/21/14 0641 08/25/14 0555  AST 60* 39 32 21  ALT 75* 57 55 38  ALKPHOS 98 88 92 63  BILITOT 5.8* 4.3* 2.4* 1.5*  PROT 5.6* 5.4* 5.9* 5.2*  ALBUMIN 3.1* 2.6* 2.9* 2.2*    Recent Labs Lab 08/20/14 0526  LIPASE 32   CBC:  Recent Labs Lab 08/20/14 0527 08/22/14 0423 08/23/14 0736 08/24/14 0717 08/25/14 0555  WBC 4.3  --  10.6 13.4* 7.9  HGB 12.0*  --  13.6 14.1 11.6*  HCT 36.5*  --  41.7 43.7 35.9*  MCV 94.1  --  93.0 93.9 94.3  PLT  169 294 256 286 242     Recent Results (from the past 240 hour(s))  Culture, blood (routine x 2)     Status: None   Collection Time: 10-Sep-2014  6:50 PM  Result Value Ref Range Status   Specimen Description BLOOD  Final   Special Requests NONE  Final   Culture NO GROWTH 6 DAYS  Final   Report Status 08/25/2014 FINAL  Final  Culture, blood (routine x 2)     Status: None   Collection Time: 2014/09/10  7:00 PM  Result Value Ref Range Status   Specimen Description BLOOD  Final   Special Requests NONE  Final   Culture NO GROWTH 6 DAYS  Final   Report Status 08/25/2014 FINAL  Final  Body fluid culture     Status: None   Collection Time: 08/20/14 11:54 AM  Result Value Ref Range Status   Specimen Description BILE  Final   Special Requests NONE  Final   Gram Stain FEW WBC SEEN NO ORGANISMS SEEN   Final   Culture NO GROWTH 3 DAYS  Final   Report Status 08/24/2014 FINAL  Final  Anaerobic culture     Status: None   Collection Time: 08/20/14 11:54 AM  Result Value Ref Range Status   Specimen Description GALL   Final   Special Requests Normal  Final   Culture NO ANAEROBES ISOLATED  Final   Report Status 08/24/2014 FINAL  Final  Culture, bal-quantitative     Status: None (Preliminary result)   Collection Time: 08/24/14  2:45 PM  Result Value Ref Range Status   Specimen Description BRONCHIAL ALVEOLAR LAVAGE  Final   Special Requests Normal  Final   Gram Stain PENDING  Incomplete   Culture NO GROWTH < 12 HOURS  Final   Report Status PENDING  Incomplete  MRSA PCR Screening     Status: None   Collection Time: 08/24/14  4:01 PM  Result Value Ref Range Status   MRSA by PCR NEGATIVE NEGATIVE Final    Comment:        The GeneXpert MRSA Assay (FDA approved for NASAL specimens only), is one component of a comprehensive MRSA colonization surveillance program. It is not intended to diagnose MRSA infection nor to guide or monitor treatment for MRSA infections.      Studies:   Scheduled Meds: . ampicillin-sulbactam (UNASYN) IV  3 g Intravenous Q6H  . budesonide (PULMICORT) nebulizer solution  0.5 mg Nebulization BID  . chlorhexidine  15 mL Mouth/Throat BID  . docusate  100 mg Oral BID  . famotidine (PEPCID) IV  20 mg Intravenous Q12H  . feeding supplement (PRO-STAT SUGAR FREE 64)  30 mL Per Tube QID  . feeding supplement (VITAL HIGH PROTEIN)  1,000 mL Per Tube Q24H  . free water  25 mL Per Tube Q4H  . gabapentin  200 mg Oral BID  . ipratropium-albuterol  3 mL Nebulization Q4H  . mirtazapine  22.5 mg Oral QHS  . OLANZapine  2.5 mg Oral QHS  . QUEtiapine  12.5 mg Oral QHS  . sennosides  10 mL Oral BID  . sertraline  50 mg Oral Daily  . traZODone  50 mg Oral QHS    Assessment/Plan:  1. Acute respiratory failure with hypoxia; patient was reintubated after ERCP. Extubated on 08/21/2014. CT scan of the chest shows bilateral pneumonia as well as effusions. Status post intubation on 08/24/14.  Bronchoscopy results are currently pending. Continue anabiotic's with Unasyn.  2.  Acute cholecystitis  with Choledocholelithiasis; patient is status post ERCP with sphincterotomy and stones extracted. Patient is also status post interventional radiology drainage of gallbladder. Patient will likely go home with the gallbladder drain. Surgical team following. IV Unasyn. Surgery to consider cholecystectomy. 3. Elevated liver function tests; this is secondary to the acute cholecystitis and choledochollithiasis. improved 4. Polio, left sided weakness.  Unable to ambulate at this point. Has always used a brace for his left leg. 5. Back pain: Seen by Ortho Evra recommend courseset for the back pain has lumbar fractures which needs to medically be managed   Code Status:     Code Status Orders        Start     Ordered   09-05-2014 1430  Full code   Continuous     09/05/14 1433    Advance Directive Documentation        Most Recent Value   Type of Advance Directive  Healthcare Power of Attorney, Living will   Pre-existing out of facility DNR order (yellow form or pink MOST form)     "MOST" Form in Place?       Family Communication: Patient's wife at the bedsideexplained to her the plan of care  Time spent:  35 min  crictical care time [facility]  [facility] PROGRESS NOTE  Alex Allison ZOX:[id number] DOB: 07-08-1945 DOA: 05-Sep-2014 PCP: Corky Downs, MD  HPI/Subjective: Patient feeling cold right now. Shaking. Does not complain of shortness of breath. No cough. Some abdominal discomfort.  Objective: Filed Vitals:   08/25/14 1400  BP: 91/59  Pulse: 66  Temp:   Resp: 16    Intake/Output Summary (Last 24 hours) at 08/25/14 1458 Last data filed at 08/25/14 1400  Gross per 24 hour  Intake 3977.14 ml  Output    650 ml  Net 3327.14 ml   Filed Weights   08/22/14 0540 08/24/14 0500 08/25/14 0601  Weight: 105 kg (231 lb 7.7 oz) 102.4 kg (225 lb 12 oz) 105.2 kg (231 lb 14.8 oz)    ROS: Review of Systems   Constitutional: Negative for fever and chills.  Eyes: Negative for blurred vision.  Respiratory: Negative for cough and shortness of breath.   Cardiovascular: Negative for chest pain.  Gastrointestinal: Positive for abdominal pain. Negative for nausea, vomiting, diarrhea and constipation.  Genitourinary: Negative for dysuria.  Musculoskeletal: Negative for joint pain.  Neurological: Negative for dizziness and headaches.    Exam: Physical Exam  HENT:  Nose: No mucosal edema.  Eyes: Conjunctivae and lids are normal. Pupils are equal, round, and reactive to light.  Neck: No JVD present. Carotid bruit is not present. No edema present. No thyroid mass and no thyromegaly present.  Cardiovascular: Regular rhythm, S1 normal and S2 normal.  Exam reveals no gallop.   No murmur heard. Pulses:      Dorsalis pedis pulses are 1+ on the right side, and 1+ on the left side.  Respiratory: No respiratory distress. He has decreased breath sounds in the right lower field and the left lower field. He has no wheezes. He has rhonchi in the right lower field. He has no rales.  GI: Soft. Bowel sounds are normal. There is no hepatosplenomegaly. There is no tenderness.  Musculoskeletal:       Right ankle: He exhibits swelling.       Left ankle: He exhibits swelling.  Lymphadenopathy:  He has no cervical adenopathy.  Neurological: He is alert.  Skin: Skin is warm. No rash noted. Nails show no clubbing.  Psychiatric: He has a normal mood and affect.    Data Reviewed: Basic Metabolic Panel:  Recent Labs Lab 08/20/14 1106 08/21/14 1610 08/22/14 0423 08/23/14 0736 08/24/14 0717 08/24/14 0719 08/25/14 0555  NA  --  141 143 142 143  --  146*  K  --  3.7 3.1* 3.9 3.5  --  3.3*  CL  --  107 105 105 108  --  114*  CO2  --  27 26 24 24   --  22  GLUCOSE  --  124* 82 106* 120*  --  129*  BUN  --  20 23* 21* 21*  --  18  CREATININE  --  0.97 0.99 0.93 0.76  --  0.78  CALCIUM  --  7.8* 8.0* 7.9* 7.9*  --   7.4*  MG 1.8 2.3 2.2  --   --  2.3 2.3  PHOS 2.2* 3.9 3.0  --   --   --  3.0   Liver Function Tests:  Recent Labs Lab 08/11/2014 0511 08/20/14 0527 08/21/14 0641 08/25/14 0555  AST 60* 39 32 21  ALT 75* 57 55 38  ALKPHOS 98 88 92 63  BILITOT 5.8* 4.3* 2.4* 1.5*  PROT 5.6* 5.4* 5.9* 5.2*  ALBUMIN 3.1* 2.6* 2.9* 2.2*    Recent Labs Lab 08/20/14 0526  LIPASE 32   CBC:  Recent Labs Lab 08/20/14 0527 08/22/14 0423 08/23/14 0736 08/24/14 0717 08/25/14 0555  WBC 4.3  --  10.6 13.4* 7.9  HGB 12.0*  --  13.6 14.1 11.6*  HCT 36.5*  --  41.7 43.7 35.9*  MCV 94.1  --  93.0 93.9 94.3  PLT 169 294 256 286 242     Recent Results (from the past 240 hour(s))  Culture, blood (routine x 2)     Status: None   Collection Time: 08/05/2014  6:50 PM  Result Value Ref Range Status   Specimen Description BLOOD  Final   Special Requests NONE  Final   Culture NO GROWTH 6 DAYS  Final   Report Status 08/25/2014 FINAL  Final  Culture, blood (routine x 2)     Status: None   Collection Time: 08/25/2014  7:00 PM  Result Value Ref Range Status   Specimen Description BLOOD  Final   Special Requests NONE  Final   Culture NO GROWTH 6 DAYS  Final   Report Status 08/25/2014 FINAL  Final  Body fluid culture     Status: None   Collection Time: 08/20/14 11:54 AM  Result Value Ref Range Status   Specimen Description BILE  Final   Special Requests NONE  Final   Gram Stain FEW WBC SEEN NO ORGANISMS SEEN   Final   Culture NO GROWTH 3 DAYS  Final   Report Status 08/24/2014 FINAL  Final  Anaerobic culture     Status: None   Collection Time: 08/20/14 11:54 AM  Result Value Ref Range Status   Specimen Description GALL  Final   Special Requests Normal  Final   Culture NO ANAEROBES ISOLATED  Final   Report Status 08/24/2014 FINAL  Final  Culture, bal-quantitative     Status: None (Preliminary result)   Collection Time: 08/24/14  2:45 PM  Result Value Ref Range Status   Specimen Description  BRONCHIAL ALVEOLAR LAVAGE  Final   Special Requests Normal  Final  Gram Stain PENDING  Incomplete   Culture NO GROWTH < 12 HOURS  Final   Report Status PENDING  Incomplete  MRSA PCR Screening     Status: None   Collection Time: 08/24/14  4:01 PM  Result Value Ref Range Status   MRSA by PCR NEGATIVE NEGATIVE Final    Comment:        The GeneXpert MRSA Assay (FDA approved for NASAL specimens only), is one component of a comprehensive MRSA colonization surveillance program. It is not intended to diagnose MRSA infection nor to guide or monitor treatment for MRSA infections.      Studies:   Scheduled Meds: . ampicillin-sulbactam (UNASYN) IV  3 g Intravenous Q6H  . budesonide (PULMICORT) nebulizer solution  0.5 mg Nebulization BID  . chlorhexidine  15 mL Mouth/Throat BID  . docusate  100 mg Oral BID  . famotidine (PEPCID) IV  20 mg Intravenous Q12H  . feeding supplement (PRO-STAT SUGAR FREE 64)  30 mL Per Tube QID  . feeding supplement (VITAL HIGH PROTEIN)  1,000 mL Per Tube Q24H  . free water  25 mL Per Tube Q4H  . gabapentin  200 mg Oral BID  . ipratropium-albuterol  3 mL Nebulization Q4H  . mirtazapine  22.5 mg Oral QHS  . OLANZapine  2.5 mg Oral QHS  . QUEtiapine  12.5 mg Oral QHS  . sennosides  10 mL Oral BID  . sertraline  50 mg Oral Daily  . traZODone  50 mg Oral QHS    Assessment/Plan:  6. Acute respiratory failure with hypoxia; patient was reintubated after ERCP. Extubated on 08/21/2014. Currently on 4.5 L of oxygen nasal cannula. Pulse ox wave is not that good and pulse ox is very variable. Continue to monitor overnight in the ICU. We will give incentive spirometry. On IV Lasix daily. 7. Acute cholecystitis with Choledocholelithiasis; patient is status post ERCP with sphincterotomy and stones extracted. Patient is also status post interventional radiology drainage of gallbladder. Patient will likely go home with the gallbladder drain. Surgical team following. IV  Unasyn. Surgery to consider outpatient cholecystectomy. 8. Elevated liver function tests; this is secondary to the acute cholecystitis and choledochollithiasis. Bilirubin trending better today. 9. Polio, left sided weakness.  Unable to ambulate at this point. Has always used a brace for his left leg.   Code Status:     Code Status Orders        Start     Ordered   2014/08/23 1430  Full code   Continuous     Aug 23, 2014 1433    Advance Directive Documentation        Most Recent Value   Type of Advance Directive  Healthcare Power of Attorney, Living will   Pre-existing out of facility DNR order (yellow form or pink MOST form)     "MOST" Form in Place?       Family Communication: Patient's wife at the bedside. Disposition Plan: To be determined  Time spent: 25 minutes  Jett Fukuda, [facility]  [facility] [place] [facility]

## 2014-08-25 NOTE — Consult Note (Signed)
PULMONARY / CRITICAL CARE MEDICINE   Name: Alex Allison MRN: [id number] DOB: [birth year]    ADMISSION DATE:  08/18/2014      Patient re-intubated yesterday, central line placed, s/p bronch showed purulent secretions Wean fio2 as tolerated fio2 at 40%  Plan for SAT/SBT in next 24-48 hrs, wife at bedside  P      Patinet  Pa  :  Past Medical History  Diagnosis Date  . Depressed   . Anxiety   . Polio   . Hypertension    Past Surgical History  Procedure Laterality Date  . Anal fissure repair     Prior to Admission medications   Medication Sig Start Date End Date Taking? Authorizing Provider  Cholecalciferol 2000 UNITS CAPS Take 1 capsule by mouth daily.   Yes Historical Provider, MD  Docusate Sodium 100 MG capsule Take 100 mg by mouth daily.   Yes Historical Provider, MD  gabapentin (NEURONTIN) 100 MG capsule Take 200 mg by mouth 2 (two) times daily.   Yes Historical Provider, MD  lisinopril (PRINIVIL,ZESTRIL) 20 MG tablet Take 20 mg by mouth every morning.   Yes Historical Provider, MD  Melatonin 5 MG TABS Take 1 tablet by mouth at bedtime.   Yes Historical Provider, MD  mirtazapine (REMERON) 45 MG tablet Take 22.5 mg by mouth at bedtime.   Yes Historical Provider, MD  OLANZapine (ZYPREXA) 2.5 MG tablet Take 2.5 mg by mouth at bedtime.   Yes Historical Provider, MD  sertraline (ZOLOFT) 50 MG tablet Take 50 mg by mouth daily.   Yes Historical Provider, MD  traZODone (DESYREL) 50 MG tablet Take 50 mg by mouth at bedtime.   Yes Historical Provider, MD  OLANZapine (ZYPREXA) 5 MG tablet Take 5 mg by mouth 2 (two) times daily.    Historical Provider, MD   No Known Allergies   FAMILY HISTORY   History reviewed. No pertinent family history.    SOCIAL HISTORY    reports that he has never smoked. He does not have any smokeless tobacco history on file. He reports that he does not drink alcohol or use illicit drugs.  Review of Systems  Unable to perform ROS: critical  illness      VITAL SIGNS    Temp:  [97.5 F (36.4 C)-97.8 F (36.6 C)] 97.8 F (36.6 C) (05/23 1900) Pulse Rate:  [56-118] 64 (05/24 0800) Resp:  [10-25] 16 (05/24 0800) BP: (72-156)/(48-91) 94/56 mmHg (05/24 0800) SpO2:  [92 %-100 %] 93 % (05/24 0800) FiO2 (%):  [40 %-50 %] 40 % (05/24 0755) Weight:  [231 lb 14.8 oz (105.2 kg)] 231 lb 14.8 oz (105.2 kg) (05/24 0601) HEMODYNAMICS:   VENTILATOR SETTINGS: Vent Mode:  [-] PRVC FiO2 (%):  [40 %-50 %] 40 % Set Rate:  [16 bmp] 16 bmp Vt Set:  [550 mL] 550 mL PEEP:  [5 cmH20] 5 cmH20 INTAKE / OUTPUT:  Intake/Output Summary (Last 24 hours) at 08/25/14 0906 Last data filed at 08/25/14 0700  Gross per 24 hour  Intake 4305.76 ml  Output    650 ml  Net 3655.76 ml       PHYSICAL EXAM   Physical Exam  Constitutional: He appears well-developed. No distress.  HENT:  Head: Normocephalic and atraumatic.  Eyes: Pupils are equal, round, and reactive to light. No scleral icterus.  Neck: Normal range of motion. Neck supple.  Cardiovascular: Normal rate and regular rhythm.   No murmur heard. Pulmonary/Chest: No respiratory distress. He has no wheezes.  He has rales.  resp distress  Abdominal: Soft. He exhibits distension. There is no tenderness.  t-tube in place  Musculoskeletal: He exhibits no edema.  Neurological: He displays normal reflexes. Coordination normal.  gcs<8T  Skin: Skin is warm. No rash noted. He is not diaphoretic.       LABS   LABS:  CBC  Recent Labs Lab 08/23/14 0736 08/24/14 0717 08/25/14 0555  WBC 10.6 13.4* 7.9  HGB 13.6 14.1 11.6*  HCT 41.7 43.7 35.9*  PLT 256 286 242   Coag's  Recent Labs Lab 08/20/14 1106  INR 1.01   BMET  Recent Labs Lab 08/23/14 0736 08/24/14 0717 08/25/14 0555  NA 142 143 146*  K 3.9 3.5 3.3*  CL 105 108 114*  CO2 _0 BUN 21* 21* 18  CREATININE 0.93 0.76 0.78  GLUCOSE 106* 120* 129*   Electrolytes  Recent Labs Lab 08/21/14 0641  08/22/14 0423 08/23/14 0736 08/24/14 0717 08/24/14 0719 08/25/14 0555  CALCIUM 7.8* 8.0* 7.9* 7.9*  --  7.4*  MG 2.3 2.2  --   --  2.3 2.3  PHOS 3.9 3.0  --   --   --  3.0   Sepsis Markers No results for input(s): LATICACIDVEN, PROCALCITON, O2SATVEN in the last 168 hours. ABG  Recent Labs Lab 08/22/14 2005 08/24/14 0510 08/24/14 0830  PHART 7.49* 7.39 7.39  PCO2ART 36 38 40  PO2ART 120* 82* 69*   Liver Enzymes  Recent Labs Lab 08/20/14 0527 08/21/14 0641 08/25/14 0555  AST 39 32 21  ALT 57 55 38  ALKPHOS 88 92 63  BILITOT 4.3* 2.4* 1.5*  ALBUMIN 2.6* 2.9* 2.2*   Cardiac Enzymes No results for input(s): TROPONINI, PROBNP in the last 168 hours. Glucose  Recent Labs Lab 09/01/2014 1841 08/24/14 1635 08/25/14 0717  GLUCAP 156* 102* 123*     Recent Results (from the past 240 hour(s))  Culture, blood (routine x 2)     Status: None (Preliminary result)   Collection Time: 08/07/2014  6:50 PM  Result Value Ref Range Status   Specimen Description BLOOD  Final   Special Requests NONE  Final   Culture NO GROWTH 4 DAYS  Final   Report Status PENDING  Incomplete  Culture, blood (routine x 2)     Status: None (Preliminary result)   Collection Time: 08/12/2014  7:00 PM  Result Value Ref Range Status   Specimen Description BLOOD  Final   Special Requests NONE  Final   Culture NO GROWTH 4 DAYS  Final   Report Status PENDING  Incomplete  Body fluid culture     Status: None   Collection Time: 08/20/14 11:54 AM  Result Value Ref Range Status   Specimen Description BILE  Final   Special Requests NONE  Final   Gram Stain FEW WBC SEEN NO ORGANISMS SEEN   Final   Culture NO GROWTH 3 DAYS  Final   Report Status 08/24/2014 FINAL  Final  Anaerobic culture     Status: None   Collection Time: 08/20/14 11:54 AM  Result Value Ref Range Status   Specimen Description GALL  Final   Special Requests Normal  Final   Culture NO ANAEROBES ISOLATED  Final   Report Status 08/24/2014  FINAL  Final  Culture, bal-quantitative     Status: None (Preliminary result)   Collection Time: 08/24/14  2:45 PM  Result Value Ref Range Status   Specimen Description BRONCHIAL ALVEOLAR LAVAGE  Final  Special Requests Normal  Final   Gram Stain PENDING  Incomplete   Culture NO GROWTH < 12 HOURS  Final   Report Status PENDING  Incomplete  MRSA PCR Screening     Status: None   Collection Time: 08/24/14  4:01 PM  Result Value Ref Range Status   MRSA by PCR NEGATIVE NEGATIVE Final    Comment:        The GeneXpert MRSA Assay (FDA approved for NASAL specimens only), is one component of a comprehensive MRSA colonization surveillance program. It is not intended to diagnose MRSA infection nor to guide or monitor treatment for MRSA infections.       IMAGING    Dg Abd 1 View  08/24/2014   CLINICAL DATA:  Evaluate nasogastric tube placement.  EXAM: ABDOMEN - 1 VIEW  COMPARISON:  08/20/2014 chest CT 08/22/2014  FINDINGS: Nasogastric tube is partially visualized in the left lower chest region. Patient has a known diaphragmatic or hiatal hernia. The tube is not completely visualized. Again noted is a large calcification in the pelvis. Pigtail catheter in the right abdomen.  IMPRESSION: The nasogastric tube is partially visualized and probably in the left lower chest. Prior chest CT demonstrated the stomach herniating into the left lower chest.   Electronically Signed   By: Markus Daft M.D.   On: 08/24/2014 14:26   Dg Chest Port 1 View  08/24/2014   CLINICAL DATA:  Endotracheal tube placement  EXAM: PORTABLE CHEST - 1 VIEW  COMPARISON:  08/20/2014  FINDINGS: Endotracheal tube tip at least 1 cm above the carina. Worsening consolidation right upper lobe right middle lobe and right lower lobe. Poor inspiratory effect.  Similar opacity left lower lobe. NG tube projects over the left heart shadow and left lung base likely due to positioning within the stomach and elevated diaphragm. Left central line  tip projects over the junction of the brachiocephalic vein and superior vena cava. No pneumothorax.  IMPRESSION: Very limited inspiratory effect. Worsening bilateral airspace disease.   Electronically Signed   By: Skipper Cliche M.D.   On: 08/24/2014 14:23      Indwelling Urinary Catheter continued, requirement due to   Reason to continue Indwelling Urinary Catheter for strict Intake/Output monitoring for hemodynamic instability     Central line-continued for IV access  RASS goal -1 to -2           ASSESSMENT/PLAN    69 yo white male with post op hypoxic  resp failure likely  from acute hypoventilation with severe sepsis from acute cholecystitis  PULMONARY-resp failure- -continue Full MV support -continue Bronchodilator Therapy -Wean Fio2 and PEEP as tolerated -will perform SAT/SBt when respiratory parameters are met attempt in next 24 hrs  Aspiration pneumonia -follow up BAL cultures -continue abx coverage  CARDIOVASCULAR -Needs ICU montioring -vasopressors as needed for septic shock  RENAL -watch UO -continue foley catheter  GASTROINTESTINAL S/p ERCP S/p  percutaneous GB drain   HEMATOLOGIC -watch h/h  INFECTIOUS -on iv unasyn for cholecystitis/aspiration pneumonia ENDOCRINE -watch FSBS  NEUROLOGIC -encephalopathy unknown etiology -precedex and ativan    I have personally obtained a history, examined the patient, evaluated laboratory and imaging results, formulated the assessment and plan and placed orders.  The Patient requires high complexity decision making for assessment and support, frequent evaluation and titration of therapies, application of advanced monitoring technologies and extensive interpretation of multiple databases. Critical Care Time devoted to patient care services described in this note is 40 minutes.   Overall,  patient is critically ill, prognosis is guarded. Patient at high risk  cardiac arrest and death.   Corrin Parker,  M.D. Pulmonary & [place] Director Intensive Care Unit   08/25/2014, 9:06 AM

## 2014-08-25 NOTE — Progress Notes (Signed)
PHARMACY - CRITICAL CARE PROGRESS NOTE  Pharmacy Consult for Electrolyte Management   Indication: ICU Status   No Known Allergies  Patient Measurements: Height: 5\' 11"  (180.3 cm) Weight: 231 lb 14.8 oz (105.2 kg) IBW/kg (Calculated) : 75.3   Vital Signs: BP: 91/59 mmHg (05/24 1400) Pulse Rate: 66 (05/24 1400) Intake/Output from previous day: 05/23 0701 - 05/24 0700 In: 4555.8 [I.V.:3695.1; NG/GT:60.7; IV Piggyback:800] Out: 650 [Urine:600; Drains:50] Intake/Output from this shift: Total I/O In: 1257 [I.V.:587; NG/GT:470; IV Piggyback:200] Out: -  Vent settings for last 24 hours: Vent Mode:  [-] PRVC FiO2 (%):  [40 %-50 %] 40 % Set Rate:  [16 bmp] 16 bmp Vt Set:  [550 mL] 550 mL PEEP:  [5 cmH20] 5 cmH20  Labs:  Recent Labs  08/23/14 0736 08/24/14 0717 08/24/14 0719 08/25/14 0555  WBC 10.6 13.4*  --  7.9  HGB 13.6 14.1  --  11.6*  HCT 41.7 43.7  --  35.9*  PLT 256 286  --  242  CREATININE 0.93 0.76  --  0.78  MG  --   --  2.3 2.3  PHOS  --   --   --  3.0  ALBUMIN  --   --   --  2.2*  PROT  --   --   --  5.2*  AST  --   --   --  21  ALT  --   --   --  38  ALKPHOS  --   --   --  63  BILITOT  --   --   --  1.5*   Estimated Creatinine Clearance: 107.6 mL/min (by C-G formula based on Cr of 0.78).   Recent Labs  08/24/14 1635 08/25/14 0717 08/25/14 1155  GLUCAP 102* 123* 131*    Microbiology: Recent Results (from the past 720 hour(s))  Culture, blood (routine x 2)     Status: None   Collection Time: 08/03/2014  6:50 PM  Result Value Ref Range Status   Specimen Description BLOOD  Final   Special Requests NONE  Final   Culture NO GROWTH 6 DAYS  Final   Report Status 08/25/2014 FINAL  Final  Culture, blood (routine x 2)     Status: None   Collection Time: 08/05/2014  7:00 PM  Result Value Ref Range Status   Specimen Description BLOOD  Final   Special Requests NONE  Final   Culture NO GROWTH 6 DAYS  Final   Report Status 08/25/2014 FINAL  Final  Body  fluid culture     Status: None   Collection Time: 08/20/14 11:54 AM  Result Value Ref Range Status   Specimen Description BILE  Final   Special Requests NONE  Final   Gram Stain FEW WBC SEEN NO ORGANISMS SEEN   Final   Culture NO GROWTH 3 DAYS  Final   Report Status 08/24/2014 FINAL  Final  Anaerobic culture     Status: None   Collection Time: 08/20/14 11:54 AM  Result Value Ref Range Status   Specimen Description GALL  Final   Special Requests Normal  Final   Culture NO ANAEROBES ISOLATED  Final   Report Status 08/24/2014 FINAL  Final  Culture, bal-quantitative     Status: None (Preliminary result)   Collection Time: 08/24/14  2:45 PM  Result Value Ref Range Status   Specimen Description BRONCHIAL ALVEOLAR LAVAGE  Final   Special Requests Normal  Final   Gram Stain PENDING  Incomplete  Culture NO GROWTH < 12 HOURS  Final   Report Status PENDING  Incomplete  MRSA PCR Screening     Status: None   Collection Time: 08/24/14  4:01 PM  Result Value Ref Range Status   MRSA by PCR NEGATIVE NEGATIVE Final    Comment:        The GeneXpert MRSA Assay (FDA approved for NASAL specimens only), is one component of a comprehensive MRSA colonization surveillance program. It is not intended to diagnose MRSA infection nor to guide or monitor treatment for MRSA infections.     Medications:  Scheduled:  . ampicillin-sulbactam (UNASYN) IV  3 g Intravenous Q6H  . budesonide (PULMICORT) nebulizer solution  0.5 mg Nebulization BID  . chlorhexidine  15 mL Mouth/Throat BID  . docusate  100 mg Oral BID  . famotidine (PEPCID) IV  20 mg Intravenous Q12H  . feeding supplement (PRO-STAT SUGAR FREE 64)  30 mL Per Tube QID  . feeding supplement (VITAL HIGH PROTEIN)  1,000 mL Per Tube Q24H  . free water  25 mL Per Tube Q4H  . gabapentin  200 mg Oral BID  . ipratropium-albuterol  3 mL Nebulization Q4H  . mirtazapine  22.5 mg Oral QHS  . OLANZapine  2.5 mg Oral QHS  . QUEtiapine  12.5 mg Oral  QHS  . sennosides  5 mL Oral BID  . sertraline  50 mg Oral Daily  . traZODone  50 mg Oral QHS   Infusions:  . dexmedetomidine 0.8 mcg/kg/hr (08/25/14 1105)  . fentaNYL infusion INTRAVENOUS 200 mcg/hr (08/25/14 0910)     Assessment: Pharmacy consulted to monitor and adjust electrolytes in this 68yo M admitted for acute cholecystitis s/p ERCP and possible percutaneous drainage. Patient was extubated on 5/20.   Plan:  1. Antibiotics - Unasyn 3gm IV Q6hrs Day 10 for acute cholecystitis. This is appropriate for renal function. Patient received 4 days of vancomycin (5/19-5/22).   2. Electrolytes - Potassium of 3.3.  Will order KCl 40 meq per tube once  Will recheck electrolytes with am labs.   3. Constipation - Patient sedated on fentanyl. Last documented BM on 5/22. Will increase senna to 10 mL VT BID and continuedocusate  VT BID.    Pharmacy will continue to monitor and adjust per consult.    Clarisa Schools, PharmD Clinical Pharmacist 08/25/2014

## 2014-08-25 NOTE — Care Management (Signed)
Continuous IV of NS discontinued per serum NA+ elevated. Cultures negative thus far. No plan to extubate today, weaning trial planned for tomorrow. Current goal is to increase tube feedings to 4048ml/hr.

## 2014-08-25 NOTE — Progress Notes (Signed)
Pt remains under full vent support, no wake up assessment today.  Pt remains sedated but does arouse easily. Not able to follow commands.  VSS.    NSR

## 2014-08-25 NOTE — Progress Notes (Signed)
Pt remains on vent. Vent settings as charted. SR on CM. Foley in place. UOp . Tube feeding is in progress. Tolerating good.sedate with fentanyl and precedex. Resting comfortably in bed without any distress.

## 2014-08-25 NOTE — Progress Notes (Signed)
Surgery  Remains ventilated Perc chole tube to drainage No surgical plans until respiratory status improved Will need interval cholecystectomy is survives ICu stay.

## 2014-08-26 ENCOUNTER — Inpatient Hospital Stay: Payer: Medicare HMO

## 2014-08-26 ENCOUNTER — Inpatient Hospital Stay (HOSPITAL_COMMUNITY)
Admit: 2014-08-26 | Discharge: 2014-08-26 | Disposition: A | Discharge status: Home / Self Care | Payer: Medicare HMO | Attending: Internal Medicine | Admitting: Internal Medicine

## 2014-08-26 DIAGNOSIS — R0689 Other abnormalities of breathing: Secondary | ICD-10-CM

## 2014-08-26 LAB — GLUCOSE, CAPILLARY
GLUCOSE-CAPILLARY: 103 mg/dL — AB (ref 65–99)
GLUCOSE-CAPILLARY: 130 mg/dL — AB (ref 65–99)
GLUCOSE-CAPILLARY: 135 mg/dL — AB (ref 65–99)
Glucose-Capillary: 133 mg/dL — ABNORMAL HIGH (ref 65–99)
Glucose-Capillary: 152 mg/dL — ABNORMAL HIGH (ref 65–99)
Glucose-Capillary: 83 mg/dL (ref 65–99)
Glucose-Capillary: 127 mg/dL — ABNORMAL HIGH (ref 65–99)

## 2014-08-26 LAB — BASIC METABOLIC PANEL
Anion gap: 3 — ABNORMAL LOW (ref 5–15)
BUN: 20 mg/dL (ref 6–20)
CALCIUM: 7.9 mg/dL — AB (ref 8.9–10.3)
CO2: 27 mmol/L (ref 22–32)
Chloride: 118 mmol/L — ABNORMAL HIGH (ref 101–111)
Creatinine, Ser: 0.71 mg/dL (ref 0.61–1.24)
GFR calc Af Amer: >60 mL/min (ref >60)
GFR calc non Af Amer: >60 mL/min (ref >60)
Glucose, Bld: 136 mg/dL — ABNORMAL HIGH (ref 65–99)
Potassium: 3.1 mmol/L — ABNORMAL LOW (ref 3.5–5.1)
SODIUM: 148 mmol/L — AB (ref 135–145)

## 2014-08-26 LAB — BLOOD GAS, ARTERIAL
ACID-BASE DEFICIT: 1.3 mmol/L (ref 0.0–2.0)
ALLENS TEST (PASS/FAIL): POSITIVE — AB
Bicarbonate: 24.8 mEq/L (ref 21.0–28.0)
FIO2: 0.35 %
LHR: 16 {breaths}/min
O2 Saturation: 88.4 %
PCO2 ART: 46 mmHg (ref 32.0–48.0)
PEEP: 5 cmH2O
Patient temperature: 37
VT: 550 mL
pH, Arterial: 7.34 — ABNORMAL LOW (ref 7.350–7.450)
pO2, Arterial: 59 mmHg — ABNORMAL LOW (ref 83.0–108.0)

## 2014-08-26 LAB — CBC
HEMATOCRIT: 35.8 % — AB (ref 40.0–52.0)
Hemoglobin: 11.7 g/dL — ABNORMAL LOW (ref 13.0–18.0)
MCH: 30.7 pg (ref 26.0–34.0)
MCHC: 32.7 g/dL (ref 32.0–36.0)
MCV: 94 fL (ref 80.0–100.0)
Platelets: 249 10*3/uL (ref 150–440)
RBC: 3.81 MIL/uL — ABNORMAL LOW (ref 4.40–5.90)
RDW: 14.8 % — ABNORMAL HIGH (ref 11.5–14.5)
WBC: 6.1 10*3/uL (ref 3.8–10.6)

## 2014-08-26 LAB — LACTIC ACID, PLASMA
LACTIC ACID, VENOUS: 1.4 mmol/L (ref 0.5–2.0)
Lactic Acid, Venous: 1.1 mmol/L (ref 0.5–2.0)

## 2014-08-26 LAB — PHOSPHORUS: Phosphorus: 2.5 mg/dL (ref 2.5–4.6)

## 2014-08-26 LAB — MAGNESIUM: Magnesium: 2.3 mg/dL (ref 1.7–2.4)

## 2014-08-26 LAB — POTASSIUM: Potassium: 4.1 mmol/L (ref 3.5–5.1)

## 2014-08-26 MED ORDER — NOREPINEPHRINE BITARTRATE 1 MG/ML IV SOLN: 0.0000 ug/min | INTRAVENOUS | Status: DC

## 2014-08-26 MED ORDER — CLONAZEPAM 0.5 MG PO TABS
0.5000 mg | ORAL_TABLET | Freq: Two times a day (BID) | ORAL | Status: DC
  Administered 2014-08-26 – 2014-08-28 (×5): 0.5 mg via ORAL
  Filled 2014-08-26 (×5): qty 1

## 2014-08-26 MED ORDER — NOREPINEPHRINE 4 MG/250ML-% IV SOLN
0.0000 ug/min | INTRAVENOUS | Status: DC
  Administered 2014-08-26: 5 ug/min via INTRAVENOUS
  Administered 2014-08-27: 10 ug/min via INTRAVENOUS
  Administered 2014-08-27: 5 ug/min via INTRAVENOUS
  Administered 2014-08-28: 2 ug/min via INTRAVENOUS
  Filled 2014-08-26 (×3): qty 250

## 2014-08-26 MED ORDER — FENTANYL 2500MCG IN NS 250ML (10MCG/ML) PREMIX INFUSION
5.0000 ug/h | INTRAVENOUS | Status: DC
  Administered 2014-08-26: 100 ug/h via INTRAVENOUS
  Administered 2014-08-27: 150 ug/h via INTRAVENOUS
  Filled 2014-08-26 (×3): qty 250

## 2014-08-26 MED ORDER — STERILE WATER FOR INJECTION IJ SOLN
INTRAMUSCULAR | Status: AC
  Administered 2014-08-26: 12:00:00
  Filled 2014-08-26: qty 10

## 2014-08-26 MED ORDER — NOREPINEPHRINE 4 MG/250ML-% IV SOLN
INTRAVENOUS | Status: AC
  Filled 2014-08-26: qty 250

## 2014-08-26 MED ORDER — FAMOTIDINE 20 MG PO TABS
20.0000 mg | ORAL_TABLET | Freq: Two times a day (BID) | ORAL | Status: DC
  Administered 2014-08-26 – 2014-08-28 (×5): 20 mg via ORAL
  Filled 2014-08-26 (×5): qty 1

## 2014-08-26 MED ORDER — VECURONIUM BROMIDE 10 MG IV SOLR
INTRAVENOUS | Status: AC
  Administered 2014-08-26: 12:00:00
  Filled 2014-08-26: qty 10

## 2014-08-26 MED ORDER — POTASSIUM CHLORIDE 20 MEQ PO PACK
20.0000 meq | PACK | Freq: Two times a day (BID) | ORAL | Status: DC
  Administered 2014-08-26 – 2014-08-28 (×5): 20 meq via ORAL
  Filled 2014-08-26 (×5): qty 1

## 2014-08-26 MED ORDER — LORAZEPAM 2 MG/ML IJ SOLN
2.0000 mg | Freq: Once | INTRAMUSCULAR | Status: AC
  Administered 2014-08-26: 2 mg via INTRAVENOUS

## 2014-08-26 MED ORDER — SODIUM CHLORIDE 0.9 % IV SOLN
Freq: Once | INTRAVENOUS | Status: AC
  Administered 2014-08-26: 11:00:00 via INTRAVENOUS
  Filled 2014-08-26: qty 20

## 2014-08-26 MED ORDER — FREE WATER
50.0000 mL | Status: DC
  Administered 2014-08-26 – 2014-08-28 (×49): 50 mL

## 2014-08-26 MED ORDER — VECURONIUM BROMIDE 10 MG IV SOLR
10.0000 mg | Freq: Once | INTRAVENOUS | Status: AC
  Administered 2014-08-26: 10 mg via INTRAVENOUS

## 2014-08-26 MED ORDER — ENOXAPARIN SODIUM 40 MG/0.4ML ~~LOC~~ SOLN
40.0000 mg | SUBCUTANEOUS | Status: DC
  Administered 2014-08-26 – 2014-08-27 (×2): 40 mg via SUBCUTANEOUS
  Filled 2014-08-26 (×2): qty 0.4

## 2014-08-26 NOTE — Consult Note (Signed)
PULMONARY / CRITICAL CARE MEDICINE   Name: Alex Allison MRN: [id number] DOB: Nov 16, 1945    ADMISSION DATE:  08/07/2014     Patient re-intubated, will plan for SAT/SBt today,  central line placed,  s/p bronch showed purulent secretions Wean fio2 as tolerated fio2 at 30%  Plan for SAT/SBT  wife/daughter at bedside Will obtain ECHO to assess LV function  tinet  Pa  :  Past Medical History  Diagnosis Date  . Depressed   . Anxiety   . Polio   . Hypertension    Past Surgical History  Procedure Laterality Date  . Anal fissure repair     Prior to Admission medications   Medication Sig Start Date End Date Taking? Authorizing Provider  Cholecalciferol 2000 UNITS CAPS Take 1 capsule by mouth daily.   Yes Historical Provider, MD  Docusate Sodium 100 MG capsule Take 100 mg by mouth daily.   Yes Historical Provider, MD  gabapentin (NEURONTIN) 100 MG capsule Take 200 mg by mouth 2 (two) times daily.   Yes Historical Provider, MD  lisinopril (PRINIVIL,ZESTRIL) 20 MG tablet Take 20 mg by mouth every morning.   Yes Historical Provider, MD  Melatonin 5 MG TABS Take 1 tablet by mouth at bedtime.   Yes Historical Provider, MD  mirtazapine (REMERON) 45 MG tablet Take 22.5 mg by mouth at bedtime.   Yes Historical Provider, MD  OLANZapine (ZYPREXA) 2.5 MG tablet Take 2.5 mg by mouth at bedtime.   Yes Historical Provider, MD  sertraline (ZOLOFT) 50 MG tablet Take 50 mg by mouth daily.   Yes Historical Provider, MD  traZODone (DESYREL) 50 MG tablet Take 50 mg by mouth at bedtime.   Yes Historical Provider, MD  OLANZapine (ZYPREXA) 5 MG tablet Take 5 mg by mouth 2 (two) times daily.    Historical Provider, MD   No Known Allergies   FAMILY HISTORY   History reviewed. No pertinent family history.    SOCIAL HISTORY    reports that he has never smoked. He does not have any smokeless tobacco history on file. He reports that he does not drink alcohol or use illicit drugs.  Review of  Systems  Unable to perform ROS: critical illness      VITAL SIGNS    Temp:  [95.2 F (35.1 C)-97.8 F (36.6 C)] 97.8 F (36.6 C) (05/25 0800) Pulse Rate:  [59-105] 77 (05/25 0900) Resp:  [10-23] 10 (05/25 0900) BP: (72-122)/(47-76) 72/47 mmHg (05/25 0900) SpO2:  [91 %-96 %] 96 % (05/25 0900) FiO2 (%):  [35 %-40 %] 35 % (05/25 0828) Weight:  [231 lb 7.7 oz (105 kg)] 231 lb 7.7 oz (105 kg) (05/25 0400) HEMODYNAMICS:   VENTILATOR SETTINGS: Vent Mode:  [-] PRVC FiO2 (%):  [35 %-40 %] 35 % Set Rate:  [16 bmp] 16 bmp Vt Set:  [550 mL] 550 mL PEEP:  [5 cmH20] 5 cmH20 INTAKE / OUTPUT:  Intake/Output Summary (Last 24 hours) at 08/26/14 4098 Last data filed at 08/26/14 0800  Gross per 24 hour  Intake 3220.21 ml  Output    615 ml  Net 2605.21 ml       PHYSICAL EXAM   Physical Exam  Constitutional: He appears well-developed. He appears distressed.  HENT:  Head: Normocephalic and atraumatic.  Eyes: Pupils are equal, round, and reactive to light. No scleral icterus.  Neck: Normal range of motion. Neck supple.  Cardiovascular: Normal rate and regular rhythm.   No murmur heard. Pulmonary/Chest: He is in  respiratory distress. He has no wheezes. He has rales.  resp distress  Abdominal: Soft. He exhibits distension. There is no tenderness.  t-tube in place  Musculoskeletal: He exhibits no edema.  Neurological: He displays normal reflexes. Coordination normal.  gcs<8T  Skin: Skin is warm. No rash noted. He is not diaphoretic.       LABS   LABS:  CBC  Recent Labs Lab 08/24/14 0717 08/25/14 0555 08/26/14 0350  WBC 13.4* 7.9 6.1  HGB 14.1 11.6* 11.7*  HCT 43.7 35.9* 35.8*  PLT 286 242 249   Coag's  Recent Labs Lab 08/20/14 1106  INR 1.01   BMET  Recent Labs Lab 08/24/14 0717 08/25/14 0555 08/26/14 0350  NA 143 146* 148*  K 3.5 3.3* 3.1*  CL 108 114* 118*  CO2 24 22 27   BUN 21* 18 20  CREATININE 0.76 0.78 0.71  GLUCOSE 120* 129* 136*    Electrolytes  Recent Labs Lab 08/22/14 0423  08/24/14 0717 08/24/14 0719 08/25/14 0555 08/26/14 0350  CALCIUM 8.0*  < > 7.9*  --  7.4* 7.9*  MG 2.2  --   --  2.3 2.3 2.3  PHOS 3.0  --   --   --  3.0 2.5  < > = values in this interval not displayed. Sepsis Markers No results for input(s): LATICACIDVEN, PROCALCITON, O2SATVEN in the last 168 hours. ABG  Recent Labs Lab 08/24/14 0510 08/24/14 0830 08/25/14 1105  PHART 7.39 7.39 7.38  PCO2ART 38 40 39  PO2ART 82* 69* 64*   Liver Enzymes  Recent Labs Lab 08/20/14 0527 08/21/14 0641 08/25/14 0555  AST 39 32 21  ALT 57 55 38  ALKPHOS 88 92 63  BILITOT 4.3* 2.4* 1.5*  ALBUMIN 2.6* 2.9* 2.2*   Cardiac Enzymes No results for input(s): TROPONINI, PROBNP in the last 168 hours. Glucose  Recent Labs Lab 08/25/14 1155 08/25/14 1627 08/25/14 2119 08/26/14 0049 08/26/14 0347 08/26/14 0719  GLUCAP 131* 148* 149* 135* 133* 152*     Recent Results (from the past 240 hour(s))  Culture, blood (routine x 2)     Status: None   Collection Time: Apr 16, 2014  6:50 PM  Result Value Ref Range Status   Specimen Description BLOOD  Final   Special Requests NONE  Final   Culture NO GROWTH 6 DAYS  Final   Report Status 08/25/2014 FINAL  Final  Culture, blood (routine x 2)     Status: None   Collection Time: Apr 16, 2014  7:00 PM  Result Value Ref Range Status   Specimen Description BLOOD  Final   Special Requests NONE  Final   Culture NO GROWTH 6 DAYS  Final   Report Status 08/25/2014 FINAL  Final  Body fluid culture     Status: None   Collection Time: 08/20/14 11:54 AM  Result Value Ref Range Status   Specimen Description BILE  Final   Special Requests NONE  Final   Gram Stain FEW WBC SEEN NO ORGANISMS SEEN   Final   Culture NO GROWTH 3 DAYS  Final   Report Status 08/24/2014 FINAL  Final  Anaerobic culture     Status: None   Collection Time: 08/20/14 11:54 AM  Result Value Ref Range Status   Specimen Description GALL   Final   Special Requests Normal  Final   Culture NO ANAEROBES ISOLATED  Final   Report Status 08/24/2014 FINAL  Final  Culture, bal-quantitative     Status: None (Preliminary result)  Collection Time: 08/24/14  2:45 PM  Result Value Ref Range Status   Specimen Description BRONCHIAL ALVEOLAR LAVAGE  Final   Special Requests Normal  Final   Gram Stain PENDING  Incomplete   Culture NO GROWTH < 12 HOURS  Final   Report Status PENDING  Incomplete  MRSA PCR Screening     Status: None   Collection Time: 08/24/14  4:01 PM  Result Value Ref Range Status   MRSA by PCR NEGATIVE NEGATIVE Final    Comment:        The GeneXpert MRSA Assay (FDA approved for NASAL specimens only), is one component of a comprehensive MRSA colonization surveillance program. It is not intended to diagnose MRSA infection nor to guide or monitor treatment for MRSA infections.       IMAGING    Dg Chest 1 View  08/26/2014   CLINICAL DATA:  History of respiratory failure.  EXAM: CHEST  1 VIEW  COMPARISON:  08/24/2014  FINDINGS: Nasogastric tube unchanged with tip and side-port over the stomach in the left upper quadrant. Endotracheal tube has tip 3.2 cm above the carina. Left IJ central venous catheter unchanged with tip obliquely oriented over the region of the SVC. Pigtail catheter projected over the right mid abdomen.  Lungs are hypoinflated with persistent bilateral central perihilar opacification right worse than left and bibasilar opacification. Findings likely due to interstitial edema with small bilateral effusions/ basilar atelectasis. No pneumothorax. Mild cardiomegaly unchanged. Remainder the exam is unchanged.  IMPRESSION: Persistent bilateral perihilar and bibasilar opacification right worse than left likely interstitial edema, although cannot exclude infection. Likely small bilateral pleural effusions/ bibasilar atelectasis.  Tubes and lines unchanged.   Electronically Signed   By: Elberta Fortis M.D.   On:  08/26/2014 07:33      Indwelling Urinary Catheter continued, requirement due to   Reason to continue Indwelling Urinary Catheter for strict Intake/Output monitoring for hemodynamic instability     Central line-continued for IV access  RASS goal -1 to -2           ASSESSMENT/PLAN    69 yo white male with post op hypoxic  resp failure likely  from acute hypoventilation with severe sepsis from acute cholecystitis and pneumonia  PULMONARY-resp failure- -plan for SAT/SBT today -continue Bronchodilator Therapy -Wean Fio2 and PEEP as tolerated  Aspiration pneumonia -follow up BAL cultures -continue abx coverage  CARDIOVASCULAR -Needs ICU montioring -vasopressors as needed for septic shock -check ECHO to assess LV function  RENAL -watch UO -continue foley catheter  GASTROINTESTINAL S/p ERCP S/p  percutaneous GB drain   HEMATOLOGIC -watch h/h  INFECTIOUS -on iv unasyn for cholecystitis/aspiration pneumonia  ENDOCRINE -watch FSBS  NEUROLOGIC -precedex and /fentanyl ativan    I have personally obtained a history, examined the patient, evaluated laboratory and imaging results, formulated the assessment and plan and placed orders.  The Patient requires high complexity decision making for assessment and support, frequent evaluation and titration of therapies, application of advanced monitoring technologies and extensive interpretation of multiple databases. Critical Care Time devoted to patient care services described in this note is 40 minutes.   Overall, patient is critically ill, prognosis is guarded. Patient at high risk  cardiac arrest and death.   Lucie Leather, M.D. Pulmonary & Critical Care Medicine [facility] Medical Director Intensive Care Unit   08/26/2014, 9:21 AM

## 2014-08-26 NOTE — Progress Notes (Signed)
Performed sedation vacation this am- placed patient off of fentanyl and kept precedex infusing.  Pt woke up and became very agitated-pt placed in spontaneous mode per Dr. Earney NavyKasa-patient would not follow commands- morphine and ativan given- then patient relaxed.  About an hour later- patient became agitated and had increased work of breathing- Per Dr. Belia HemanKasa pt placed back on fentanyl drip and patient resting comfortably since.  Echo performed.  Decreased urine output- Dr. Belia HemanKasa and Dr. Allena KatzPatel made aware.  Klonidine started.  ABG showing ph 7.34 and lactic acid normal.  Tolerating tube feeds.  Potassium replaced.Wife and duaghter at bedside.

## 2014-08-26 NOTE — Progress Notes (Signed)
[facility] PROGRESS NOTE  Dyke Bracketthomas W Weingarten ZOX:[id number] DOB: December 15, 1945 DOA: 08/26/2014 PCP: Corky DownsMASOUD,JAVED, MD  HPI/Subjective: Remains on the ventilator, his course has been held to see if patient would be able to tolerate spontaneous breathing trials. His currently tachypnea And is not responsive.   Objective: Filed Vitals:   08/26/14 1127  BP: 107/77  Pulse:   Temp:   Resp:     Intake/Output Summary (Last 24 hours) at 08/26/14 1139 Last data filed at 08/26/14 1100  Gross per 24 hour  Intake 2780.91 ml  Output    615 ml  Net 2165.91 ml   Filed Weights   08/24/14 0500 08/25/14 0601 08/26/14 0400  Weight: 102.4 kg (225 lb 12 oz) 105.2 kg (231 lb 14.8 oz) 105 kg (231 lb 7.7 oz)    ROS: Review of Systems   unable to give any review of systems due intubation   Exam: Physical Exam  General: Critically ill-appearing male ,  HENT:  Nose: No mucosal edema.  Eyes: Conjunctivae and lids are normal. Pupils are equal, round, and reactive to light.  Neck: No JVD present. Carotid bruit is not present. No edema present. No thyroid mass and no thyromegaly present.  Cardiovascular: Regular rhythm, S1 normal and S2 normal.  Exam reveals no gallop.   No murmur heard. Pulses:      Dorsalis pedis pulses are 1+ on the right side, and 1+ on the left side.  Respiratory: Currently on ventilator decreased breath sounds bilaterally .  GI: Soft. Bowel sounds are normal. There is no hepatosplenomegaly. There is no tenderness.  Musculoskeletal:       Right ankle: He exhibits swelling.       Left ankle: He exhibits swelling.  Lymphadenopathy:    He has no cervical adenopathy.  Neurological: Decreased responsiveness.  Skin: Skin is warm. No rash noted. Nails show no clubbing.  Psychiatric: Decreased responsive Data Reviewed: Basic Metabolic Panel:  Recent Labs Lab 08/20/14 1106 08/21/14 [id number] 08/22/14 0423 08/23/14 0736 08/24/14 0717 08/24/14 0719 08/25/14 0555  08/26/14 0350  NA  --  141 143 142 143  --  146* 148*  K  --  3.7 3.1* 3.9 3.5  --  3.3* 3.1*  CL  --  107 105 105 108  --  114* 118*  CO2  --  27 26 24 24   --  22 27  GLUCOSE  --  124* 82 106* 120*  --  129* 136*  BUN  --  20 23* 21* 21*  --  18 20  CREATININE  --  0.97 0.99 0.93 0.76  --  0.78 0.71  CALCIUM  --  7.8* 8.0* 7.9* 7.9*  --  7.4* 7.9*  MG 1.8 2.3 2.2  --   --  2.3 2.3 2.3  PHOS 2.2* 3.9 3.0  --   --   --  3.0 2.5   Liver Function Tests:  Recent Labs Lab 08/20/14 0527 08/21/14 0641 08/25/14 0555  AST 39 32 21  ALT 57 55 38  ALKPHOS 88 92 63  BILITOT 4.3* 2.4* 1.5*  PROT 5.4* 5.9* 5.2*  ALBUMIN 2.6* 2.9* 2.2*    Recent Labs Lab 08/20/14 0526  LIPASE 32   CBC:  Recent Labs Lab 08/20/14 0527 08/22/14 0423 08/23/14 0736 08/24/14 0717 08/25/14 0555 08/26/14 0350  WBC 4.3  --  10.6 13.4* 7.9 6.1  HGB 12.0*  --  13.6 14.1 11.6* 11.7*  HCT 36.5*  --  41.7 43.7 35.9* 35.8*  MCV 94.1  --  93.0 93.9 94.3 94.0  PLT 169 294 256 286 242 249     Recent Results (from the past 240 hour(s))  Culture, blood (routine x 2)     Status: None   Collection Time: 08/17/2014  6:50 PM  Result Value Ref Range Status   Specimen Description BLOOD  Final   Special Requests NONE  Final   Culture NO GROWTH 6 DAYS  Final   Report Status 08/25/2014 FINAL  Final  Culture, blood (routine x 2)     Status: None   Collection Time: 08/27/2014  7:00 PM  Result Value Ref Range Status   Specimen Description BLOOD  Final   Special Requests NONE  Final   Culture NO GROWTH 6 DAYS  Final   Report Status 08/25/2014 FINAL  Final  Body fluid culture     Status: None   Collection Time: 08/20/14 11:54 AM  Result Value Ref Range Status   Specimen Description BILE  Final   Special Requests NONE  Final   Gram Stain FEW WBC SEEN NO ORGANISMS SEEN   Final   Culture NO GROWTH 3 DAYS  Final   Report Status 08/24/2014 FINAL  Final  Anaerobic culture     Status: None   Collection Time:  08/20/14 11:54 AM  Result Value Ref Range Status   Specimen Description GALL  Final   Special Requests Normal  Final   Culture NO ANAEROBES ISOLATED  Final   Report Status 08/24/2014 FINAL  Final  Culture, bal-quantitative     Status: None (Preliminary result)   Collection Time: 08/24/14  2:45 PM  Result Value Ref Range Status   Specimen Description BRONCHIAL ALVEOLAR LAVAGE  Final   Special Requests Normal  Final   Gram Stain PENDING  Incomplete   Culture NO GROWTH < 12 HOURS  Final   Report Status PENDING  Incomplete  MRSA PCR Screening     Status: None   Collection Time: 08/24/14  4:01 PM  Result Value Ref Range Status   MRSA by PCR NEGATIVE NEGATIVE Final    Comment:        The GeneXpert MRSA Assay (FDA approved for NASAL specimens only), is one component of a comprehensive MRSA colonization surveillance program. It is not intended to diagnose MRSA infection nor to guide or monitor treatment for MRSA infections.      Studies:   Scheduled Meds: . ampicillin-sulbactam (UNASYN) IV  3 g Intravenous Q6H  . budesonide (PULMICORT) nebulizer solution  0.5 mg Nebulization BID  . chlorhexidine  15 mL Mouth/Throat BID  . clonazePAM  0.5 mg Oral BID  . docusate  100 mg Oral BID  . enoxaparin (LOVENOX) injection  40 mg Subcutaneous Q24H  . famotidine  20 mg Oral BID  . feeding supplement (PRO-STAT SUGAR FREE 64)  30 mL Per Tube QID  . feeding supplement (VITAL HIGH PROTEIN)  1,000 mL Per Tube Q24H  . free water  50 mL Per Tube Q1H  . gabapentin  200 mg Oral BID  . ipratropium-albuterol  3 mL Nebulization Q4H  . mirtazapine  22.5 mg Oral QHS  . OLANZapine  2.5 mg Oral QHS  . potassium chloride  20 mEq Oral BID  . small volume/piggyback builder   Intravenous Once  . QUEtiapine  12.5 mg Oral QHS  . sennosides  10 mL Oral BID  . sertraline  50 mg Oral Daily  . sterile water (preservative free)      .  traZODone  50 mg Oral QHS  . vecuronium         Assessment/Plan:  1. Acute respiratory failure with hypoxia; patient was reintubated after ERCP. Extubated on 08/21/2014. CT scan of the chest shows bilateral pneumonia as well as effusions. Status post intubation on 08/24/14.  Bronchoscopy results are currently pending. Continue anabiotic's with Unasyn. Weaning trials are being attempted. Echocardiogram of the heart has also been ordered. Continue to monitor closely  2. Acute cholecystitis with Choledocholelithiasis; patient is status post ERCP with sphincterotomy and stones extracted. Patient is also status post interventional radiology drainage of gallbladder. Patient will likely go home with the gallbladder drain. Surgical team following. IV Unasyn. Surgery to consider cholecystectomy. 3. Elevated liver function tests; this is secondary to the acute cholecystitis and choledochollithiasis. improved 4. Polio, left sided weakness.  Unable to ambulate at this point. Has always used a brace for his left leg. 5. Back pain: Seen by Ortho Evra recommend courseset for the back pain has lumbar fractures which needs to medically be managed   Code Status:     Code Status Orders        Start     Ordered   08/10/2014 1430  Full code   Continuous     08/15/2014 1433    Advance Directive Documentation        Most Recent Value   Type of Advance Directive  Healthcare Power of Attorney, Living will   Pre-existing out of facility DNR order (yellow form or pink MOST form)     "MOST" Form in Place?       Family Communication: Patient's wife at the bedsideexplained to her the plan of care  Time spent:  30 min  crictical care time [facility]ena Katz [facility]  [facility] PROGRESS NOTE  COLAN LAYMON ZOX:[id number] DOB: 1946/03/07 DOA: 08/20/2014 PCP: Corky Downs, MD  HPI/Subjective: Patient feeling cold right now. Shaking. Does not complain of shortness of breath. No cough. Some abdominal  discomfort.  Objective: Filed Vitals:   08/26/14 1127  BP: 107/77  Pulse:   Temp:   Resp:     Intake/Output Summary (Last 24 hours) at 08/26/14 1139 Last data filed at 08/26/14 1100  Gross per 24 hour  Intake 2780.91 ml  Output    615 ml  Net 2165.91 ml   Filed Weights   08/24/14 0500 08/25/14 0601 08/26/14 0400  Weight: 102.4 kg (225 lb 12 oz) 105.2 kg (231 lb 14.8 oz) 105 kg (231 lb 7.7 oz)    ROS: Review of Systems  Constitutional: Negative for fever and chills.  Eyes: Negative for blurred vision.  Respiratory: Negative for cough and shortness of breath.   Cardiovascular: Negative for chest pain.  Gastrointestinal: Positive for abdominal pain. Negative for nausea, vomiting, diarrhea and constipation.  Genitourinary: Negative for dysuria.  Musculoskeletal: Negative for joint pain.  Neurological: Negative for dizziness and headaches.    Exam: Physical Exam  HENT:  Nose: No mucosal edema.  Eyes: Conjunctivae and lids are normal. Pupils are equal, round, and reactive to light.  Neck: No JVD present. Carotid bruit is not present. No edema present. No thyroid mass and no thyromegaly present.  Cardiovascular: Regular rhythm, S1 normal and S2 normal.  Exam reveals no gallop.   No murmur heard. Pulses:      Dorsalis pedis pulses are 1+ on the right side, and 1+ on the left side.  Respiratory:  No respiratory distress. He has decreased breath sounds in the right lower field and the left lower field. He has no wheezes. He has rhonchi in the right lower field. He has no rales.  GI: Soft. Bowel sounds are normal. There is no hepatosplenomegaly. There is no tenderness.  Musculoskeletal:       Right ankle: He exhibits swelling.       Left ankle: He exhibits swelling.  Lymphadenopathy:    He has no cervical adenopathy.  Neurological: He is alert.  Skin: Skin is warm. No rash noted. Nails show no clubbing.  Psychiatric: He has a normal mood and affect.    Data  Reviewed: Basic Metabolic Panel:  Recent Labs Lab 08/20/14 1106 08/21/14 1610 08/22/14 0423 08/23/14 [id number] 08/24/14 0717 08/24/14 0719 08/25/14 0555 08/26/14 0350  NA  --  141 143 142 143  --  146* 148*  K  --  3.7 3.1* 3.9 3.5  --  3.3* 3.1*  CL  --  107 105 105 108  --  114* 118*  CO2  --  27 26 24 24   --  22 27  GLUCOSE  --  124* 82 106* 120*  --  129* 136*  BUN  --  20 23* 21* 21*  --  18 20  CREATININE  --  0.97 0.99 0.93 0.76  --  0.78 0.71  CALCIUM  --  7.8* 8.0* 7.9* 7.9*  --  7.4* 7.9*  MG 1.8 2.3 2.2  --   --  2.3 2.3 2.3  PHOS 2.2* 3.9 3.0  --   --   --  3.0 2.5   Liver Function Tests:  Recent Labs Lab 08/20/14 0527 08/21/14 0641 08/25/14 0555  AST 39 32 21  ALT 57 55 38  ALKPHOS 88 92 63  BILITOT 4.3* 2.4* 1.5*  PROT 5.4* 5.9* 5.2*  ALBUMIN 2.6* 2.9* 2.2*    Recent Labs Lab 08/20/14 0526  LIPASE 32   CBC:  Recent Labs Lab 08/20/14 0527 08/22/14 0423 08/23/14 0736 08/24/14 0717 08/25/14 0555 08/26/14 0350  WBC 4.3  --  10.6 13.4* 7.9 6.1  HGB 12.0*  --  13.6 14.1 11.6* 11.7*  HCT 36.5*  --  41.7 43.7 35.9* 35.8*  MCV 94.1  --  93.0 93.9 94.3 94.0  PLT 169 294 256 286 242 249     Recent Results (from the past 240 hour(s))  Culture, blood (routine x 2)     Status: None   Collection Time: 04-Sep-2014  6:50 PM  Result Value Ref Range Status   Specimen Description BLOOD  Final   Special Requests NONE  Final   Culture NO GROWTH 6 DAYS  Final   Report Status 08/25/2014 FINAL  Final  Culture, blood (routine x 2)     Status: None   Collection Time: September 04, 2014  7:00 PM  Result Value Ref Range Status   Specimen Description BLOOD  Final   Special Requests NONE  Final   Culture NO GROWTH 6 DAYS  Final   Report Status 08/25/2014 FINAL  Final  Body fluid culture     Status: None   Collection Time: 08/20/14 11:54 AM  Result Value Ref Range Status   Specimen Description BILE  Final   Special Requests NONE  Final   Gram Stain FEW WBC SEEN NO  ORGANISMS SEEN   Final   Culture NO GROWTH 3 DAYS  Final   Report Status 08/24/2014 FINAL  Final  Anaerobic culture  Status: None   Collection Time: 08/20/14 11:54 AM  Result Value Ref Range Status   Specimen Description GALL  Final   Special Requests Normal  Final   Culture NO ANAEROBES ISOLATED  Final   Report Status 08/24/2014 FINAL  Final  Culture, bal-quantitative     Status: None (Preliminary result)   Collection Time: 08/24/14  2:45 PM  Result Value Ref Range Status   Specimen Description BRONCHIAL ALVEOLAR LAVAGE  Final   Special Requests Normal  Final   Gram Stain PENDING  Incomplete   Culture NO GROWTH < 12 HOURS  Final   Report Status PENDING  Incomplete  MRSA PCR Screening     Status: None   Collection Time: 08/24/14  4:01 PM  Result Value Ref Range Status   MRSA by PCR NEGATIVE NEGATIVE Final    Comment:        The GeneXpert MRSA Assay (FDA approved for NASAL specimens only), is one component of a comprehensive MRSA colonization surveillance program. It is not intended to diagnose MRSA infection nor to guide or monitor treatment for MRSA infections.      Studies:   Scheduled Meds: . ampicillin-sulbactam (UNASYN) IV  3 g Intravenous Q6H  . budesonide (PULMICORT) nebulizer solution  0.5 mg Nebulization BID  . chlorhexidine  15 mL Mouth/Throat BID  . clonazePAM  0.5 mg Oral BID  . docusate  100 mg Oral BID  . enoxaparin (LOVENOX) injection  40 mg Subcutaneous Q24H  . famotidine  20 mg Oral BID  . feeding supplement (PRO-STAT SUGAR FREE 64)  30 mL Per Tube QID  . feeding supplement (VITAL HIGH PROTEIN)  1,000 mL Per Tube Q24H  . free water  50 mL Per Tube Q1H  . gabapentin  200 mg Oral BID  . ipratropium-albuterol  3 mL Nebulization Q4H  . mirtazapine  22.5 mg Oral QHS  . OLANZapine  2.5 mg Oral QHS  . potassium chloride  20 mEq Oral BID  . small volume/piggyback builder   Intravenous Once  . QUEtiapine  12.5 mg Oral QHS  . sennosides  10 mL Oral  BID  . sertraline  50 mg Oral Daily  . sterile water (preservative free)      . traZODone  50 mg Oral QHS  . vecuronium        Assessment/Plan:  6. Acute respiratory failure with hypoxia; patient was reintubated after ERCP. Extubated on 08/21/2014. Currently on 4.5 L of oxygen nasal cannula. Pulse ox wave is not that good and pulse ox is very variable. Continue to monitor overnight in the ICU. We will give incentive spirometry. On IV Lasix daily. 7. Acute cholecystitis with Choledocholelithiasis; patient is status post ERCP with sphincterotomy and stones extracted. Patient is also status post interventional radiology drainage of gallbladder. Patient will likely go home with the gallbladder drain. Surgical team following. IV Unasyn. Surgery to consider outpatient cholecystectomy. 8. Elevated liver function tests; this is secondary to the acute cholecystitis and choledochollithiasis. Bilirubin trending better today. 9. Polio, left sided weakness.  Unable to ambulate at this point. Has always used a brace for his left leg.   Code Status:     Code Status Orders        Start     Ordered   08-17-14 1430  Full code   Continuous     08/17/2014 1433    Advance Directive Documentation        Most Recent Value   Type of Advance Directive  Healthcare Power of Attorney, Living will   Pre-existing out of facility DNR order (yellow form or pink MOST form)     "MOST" Form in Place?       Family Communication: Patient's wife at the bedside. Disposition Plan: To be determined  Time spent: 25 minutes  Malak Orantes, [facility]  [facility]

## 2014-08-26 NOTE — Progress Notes (Signed)
   08/26/14 1100  Vent Select  Invasive or Noninvasive Invasive  Adult Vent Y  Airway 8.5 mm  Placement Date/Time: 08/24/14 1030   Placed By: (c) Other (Comment)  Airway Device: Endotracheal Tube  ETT Types: Oral  Size (mm): 8.5 mm  Insertion attempts: 1  Placement Confirmation: CXR Confirmed  Secured at (cm): 27 cm  Secured at (cm) 26 cm  Measured From Lips  Secured Location Center  Secured By Commercial Tube Holder  Tube Holder Repositioned Yes  Adult Ventilator Settings  Vent Type Servo i  Humidity HME  Vent Mode PRVC  Vt Set 550 mL  Set Rate 16 bmp  FiO2 (%) 35 %  I Time 1 Sec(s)  PEEP 5 cmH20  Adult Ventilator Measurements  Peak Airway Pressure 27 L/min  Mean Airway Pressure 11 cmH20  Resp Rate Spontaneous 4 br/min  Resp Rate Total 20 br/min  Exhaled Vt 537 mL  Measured Ve 9 mL  I:E Ratio Measured 1:2.7  HOB> 30 Degrees Y  Adult Ventilator Alarms  Alarms On Y  Ve High Alarm 20 L/min  Ve Low Alarm 5 L/min  Resp Rate High Alarm 35 br/min  Resp Rate Low Alarm 5  PEEP Low Alarm 2 cmH2O  Press High Alarm 40 cmH2O  Suction Method  Suctioning Airway  Airway Suctioning/Secretions  Suction Type ETT  Suction Device  Inline  Secretion Amount Copious  Secretion Color Pink tinged;Tan  Secretion Consistency Thick  Suction Tolerance Tolerated well  Suctioning Adverse Effects None  Placed patient back on PRVC due to aggitation and per MD request.  RN is aware.

## 2014-08-26 NOTE — Progress Notes (Signed)
Nutrition Follow-up  DOCUMENTATION CODES:     INTERVENTION:   (Coordination of care: Per Dr Belia HemanKasa planning to increase free water to 4150ml/hr to improve Na level. Continue vital high protein at goal rate of 2840ml/hr and prostat times 4 to meet nutritional needs. )  NUTRITION DIAGNOSIS:  Inadequate oral intake related to inability to eat as evidenced by NPO status, ongoing.    GOAL:   (Goal would be for pt to continue to tolerate tube feeding at goal rate)    MONITOR:   (Energy Intake, Electrolyte and Renal Profile, Pulmonary Profile, Anthropometrics, Digestive system)  REASON FOR ASSESSMENT:   (follow-up)    ASSESSMENT:  Pt remains on vent.   Electrolyte and Renal Profile:    Recent Labs Lab 08/22/14 0423  08/24/14 0717 08/24/14 0719 08/25/14 0555 08/26/14 0350  BUN 23*  < > 21*  --  18 20  CREATININE 0.99  < > 0.76  --  0.78 0.71  NA 143  < > 143  --  146* 148*  K 3.1*  < > 3.5  --  3.3* 3.1*  MG 2.2  --   --  2.3 2.3 2.3  PHOS 3.0  --   --   --  3.0 2.5  < > = values in this interval not displayed.   Height:  Ht Readings from Last 1 Encounters:  08/20/14 5\' 11"  (1.803 m)    Weight:  Wt Readings from Last 1 Encounters:  08/26/14 231 lb 7.7 oz (105 kg)     BMI:  Body mass index is 32.3 kg/(m^2).  Estimated Nutritional Needs:  Kcal:  11-14 kcals/kg (Using actual wt of 102 kg) [id number] kcals/d  Protein:  (156-195 gm/d (2.0-2.5 gm/kg) Using IBW of 78kg  Fluid:  1950-231840ml/d (25-5930ml/kg) Using IBW of 78kg  Skin:  Reviewed, no issues  Diet Order:     EDUCATION NEEDS:  No education needs identified at this time   Intake/Output Summary (Last 24 hours) at 08/26/14 1132 Last data filed at 08/26/14 1000  Gross per 24 hour  Intake 2529.81 ml  Output    615 ml  Net 1914.81 ml    Last BM:  5/21   HIGH Care Level Karia Ehresman B. Freida BusmanAllen, RD, LDN [phone] (pager)

## 2014-08-26 NOTE — Progress Notes (Signed)
*  PRELIMINARY RESULTS* Echocardiogram 2D Echocardiogram has been performed.  Georgann HousekeeperJerry R Hege 08/26/2014, 12:59 PM

## 2014-08-26 NOTE — Progress Notes (Addendum)
PHARMACY - CRITICAL CARE PROGRESS NOTE  Pharmacy Consult for Electrolyte Management   Indication: ICU Status   No Known Allergies  Patient Measurements: Height:  (180.3 cm) Weight: 231 lb 7.7 oz (105 kg) IBW/kg (Calculated) : 75.3   Vital Signs: Temp: 97.8 F (36.6 C) (05/25 0800) Temp Source: Oral (05/25 0800) BP: 107/77 mmHg (05/25 1100) Pulse Rate: 105 (05/25 1100) Intake/Output from previous day: 05/24 0701 - 05/25 0700 In: 3373.9 [I.V.:1572.9; NG/GT:1251; IV Piggyback:550] Out: 615 [Urine:575; Drains:40] Intake/Output from this shift: Total I/O In: 163.9 [I.V.:138.9; NG/GT:25] Out: -  Vent settings for last 24 hours: Vent Mode:  [-] PRVC FiO2 (%):  [35 %-40 %] 35 % Set Rate:  [16 bmp] 16 bmp Vt Set:  [550 mL] 550 mL PEEP:  [5 cmH20] 5 cmH20  Labs:  Recent Labs  08/24/14 0717 08/24/14 0719 08/25/14 0555 08/26/14 0350  WBC 13.4*  --  7.9 6.1  HGB 14.1  --  11.6* 11.7*  HCT 43.7  --  35.9* 35.8*  PLT 286  --  242 249  CREATININE 0.76  --  0.78 0.71  MG  --  2.3 2.3 2.3  PHOS  --   --  3.0 2.5  ALBUMIN  --   --  2.2*  --   PROT  --   --  5.2*  --   AST  --   --  21  --   ALT  --   --  38  --   ALKPHOS  --   --  63  --   BILITOT  --   --  1.5*  --    Estimated Creatinine Clearance: 107.5 mL/min (by C-G formula based on Cr of 0.71).   Recent Labs  08/26/14 0049 08/26/14 0347 08/26/14 0719  GLUCAP 135* 133* 152*    Microbiology: Recent Results (from the past 720 hour(s))  Culture, blood (routine x 2)     Status: None   Collection Time: 09/12/14  6:50 PM  Result Value Ref Range Status   Specimen Description BLOOD  Final   Special Requests NONE  Final   Culture NO GROWTH 6 DAYS  Final   Report Status 08/25/2014 FINAL  Final  Culture, blood (routine x 2)     Status: None   Collection Time: 09/12/14  7:00 PM  Result Value Ref Range Status   Specimen Description BLOOD  Final   Special Requests NONE  Final   Culture NO GROWTH 6 DAYS   Final   Report Status 08/25/2014 FINAL  Final  Body fluid culture     Status: None   Collection Time: 08/20/14 11:54 AM  Result Value Ref Range Status   Specimen Description BILE  Final   Special Requests NONE  Final   Gram Stain FEW WBC SEEN NO ORGANISMS SEEN   Final   Culture NO GROWTH 3 DAYS  Final   Report Status 08/24/2014 FINAL  Final  Anaerobic culture     Status: None   Collection Time: 08/20/14 11:54 AM  Result Value Ref Range Status   Specimen Description GALL  Final   Special Requests Normal  Final   Culture NO ANAEROBES ISOLATED  Final   Report Status 08/24/2014 FINAL  Final  Culture, bal-quantitative     Status: None (Preliminary result)   Collection Time: 08/24/14  2:45 PM  Result Value Ref Range Status   Specimen Description BRONCHIAL ALVEOLAR LAVAGE  Final   Special Requests Normal  Final  Gram Stain PENDING  Incomplete   Culture NO GROWTH < 12 HOURS  Final   Report Status PENDING  Incomplete  MRSA PCR Screening     Status: None   Collection Time: 08/24/14  4:01 PM  Result Value Ref Range Status   MRSA by PCR NEGATIVE NEGATIVE Final    Comment:        The GeneXpert MRSA Assay (FDA approved for NASAL specimens only), is one component of a comprehensive MRSA colonization surveillance program. It is not intended to diagnose MRSA infection nor to guide or monitor treatment for MRSA infections.     Medications:  Scheduled:  . ampicillin-sulbactam (UNASYN) IV  3 g Intravenous Q6H  . budesonide (PULMICORT) nebulizer solution  0.5 mg Nebulization BID  . chlorhexidine  15 mL Mouth/Throat BID  . clonazePAM  0.5 mg Oral BID  . docusate  100 mg Oral BID  . enoxaparin (LOVENOX) injection  40 mg Subcutaneous Q24H  . famotidine  20 mg Oral BID  . feeding supplement (PRO-STAT SUGAR FREE 64)  30 mL Per Tube QID  . feeding supplement (VITAL HIGH PROTEIN)  1,000 mL Per Tube Q24H  . free water  50 mL Per Tube Q1H  . gabapentin  200 mg Oral BID  .  ipratropium-albuterol  3 mL Nebulization Q4H  . LORazepam  2 mg Intravenous Once  . mirtazapine  22.5 mg Oral QHS  . OLANZapine  2.5 mg Oral QHS  . potassium chloride  20 mEq Oral BID  . small volume/piggyback builder   Intravenous Once  . QUEtiapine  12.5 mg Oral QHS  . sennosides  10 mL Oral BID  . sertraline  50 mg Oral Daily  . sterile water (preservative free)      . traZODone  50 mg Oral QHS  . vecuronium      . vecuronium  10 mg Intravenous Once   Infusions:  . dexmedetomidine 1 mcg/kg/hr (08/26/14 1051)     Assessment: Pharmacy consulted to monitor and adjust electrolytes in this 69yo M admitted for acute cholecystitis s/p ERCP and possible percutaneous drainage. Patient was extubated on 5/20.   Plan:  1. Antibiotics - Unasyn 3gm IV Q6hrs Day 11 for acute cholecystitis. This is appropriate for renal function. Patient received 4 days of vancomycin (5/19-5/22).   2. Electrolytes - Potassium of 3.1.  Will order KCl 40 mEq IV x 1 and order potassium 20mEq PO VT BID. Will recheck potassium at 1600.   3. Constipation - Last documented BM on 5/22, but patient now off fentanyl drip. Will continue senna to 10 mL VT BID and continue docusate 100mg  VT BID.    Pharmacy will continue to monitor and adjust per consult.    Charlies SilversMichael Simpson  08/26/2014  Potassium is 4.1 so no need for further supplementation at this time. Will f/u AM labs.   Luisa HartScott Zyniah Ferraiolo, PharmD

## 2014-08-27 DIAGNOSIS — J9600 Acute respiratory failure, unspecified whether with hypoxia or hypercapnia: Secondary | ICD-10-CM

## 2014-08-27 DIAGNOSIS — F329 Major depressive disorder, single episode, unspecified: Secondary | ICD-10-CM

## 2014-08-27 DIAGNOSIS — K810 Acute cholecystitis: Secondary | ICD-10-CM

## 2014-08-27 DIAGNOSIS — G8929 Other chronic pain: Secondary | ICD-10-CM

## 2014-08-27 DIAGNOSIS — F419 Anxiety disorder, unspecified: Secondary | ICD-10-CM

## 2014-08-27 DIAGNOSIS — M818 Other osteoporosis without current pathological fracture: Secondary | ICD-10-CM

## 2014-08-27 DIAGNOSIS — Z87310 Personal history of (healed) osteoporosis fracture: Secondary | ICD-10-CM

## 2014-08-27 DIAGNOSIS — I10 Essential (primary) hypertension: Secondary | ICD-10-CM

## 2014-08-27 DIAGNOSIS — Z8612 Personal history of poliomyelitis: Secondary | ICD-10-CM

## 2014-08-27 DIAGNOSIS — Z515 Encounter for palliative care: Secondary | ICD-10-CM

## 2014-08-27 LAB — BASIC METABOLIC PANEL
Anion gap: 4 — ABNORMAL LOW (ref 5–15)
BUN: 31 mg/dL — ABNORMAL HIGH (ref 6–20)
CALCIUM: 8.1 mg/dL — AB (ref 8.9–10.3)
CO2: 26 mmol/L (ref 22–32)
Chloride: 116 mmol/L — ABNORMAL HIGH (ref 101–111)
Creatinine, Ser: 0.98 mg/dL (ref 0.61–1.24)
GFR calc non Af Amer: >60 mL/min (ref >60)
Glucose, Bld: 166 mg/dL — ABNORMAL HIGH (ref 65–99)
POTASSIUM: 3.8 mmol/L (ref 3.5–5.1)
Sodium: 146 mmol/L — ABNORMAL HIGH (ref 135–145)

## 2014-08-27 LAB — PHOSPHORUS: Phosphorus: 3.1 mg/dL (ref 2.5–4.6)

## 2014-08-27 LAB — MAGNESIUM: Magnesium: 2.2 mg/dL (ref 1.7–2.4)

## 2014-08-27 LAB — GLUCOSE, CAPILLARY
GLUCOSE-CAPILLARY: 141 mg/dL — AB (ref 65–99)
GLUCOSE-CAPILLARY: 137 mg/dL — AB (ref 65–99)
Glucose-Capillary: 126 mg/dL — ABNORMAL HIGH (ref 65–99)
Glucose-Capillary: 132 mg/dL — ABNORMAL HIGH (ref 65–99)
Glucose-Capillary: 147 mg/dL — ABNORMAL HIGH (ref 65–99)

## 2014-08-27 MED ORDER — PRO-STAT SUGAR FREE PO LIQD
30.0000 mL | Freq: Every day | ORAL | Status: DC
  Administered 2014-08-27 – 2014-08-28 (×6): 30 mL

## 2014-08-27 MED ORDER — ADULT MULTIVITAMIN LIQUID CH
5.0000 mL | Freq: Every day | ORAL | Status: DC
  Administered 2014-08-27 – 2014-08-28 (×2): 5 mL via ORAL
  Filled 2014-08-27 (×2): qty 5

## 2014-08-27 NOTE — Progress Notes (Signed)
[facility] PRO[facility]ESS [facility]  Alex Allison ZOX:[id number] DOB: 12-Jul-1945 DOA: 04/24/2014 PCP: Corky DownsMASOUD,JAVED, MD  HPI/Subjective: Remains on the ventilator, when sedation has been held patient starts becoming tachypnea.  Objective: Filed Vitals:   08/27/14 1000  BP: 96/64  Pulse: 64  Temp:   Resp: 9    Intake/Output Summary (Last 24 hours) at 08/27/14 1246 Last data filed at 08/27/14 1100  Gross per 24 hour  Intake 3954.05 ml  Output    770 ml  Net 3184.05 ml   Filed Weights   08/25/14 0601 08/26/14 0400 08/27/14 0500  Weight: 105.2 kg (231 lb 14.8 oz) 105 kg (231 lb 7.7 oz) 108.2 kg (238 lb 8.6 oz)    ROS: Review of Systems   unable to give any review of systems due intubation   Exam: Physical Exam  General: Critically ill-appearing male ,  HENT:  Nose: No mucosal edema.  Eyes: Conjunctivae and lids are normal. Pupils are equal, round, and reactive to light.  Neck: No JVD present. Carotid bruit is not present. No edema present. No thyroid mass and no thyromegaly present.  Cardiovascular: Regular rhythm, S1 normal and S2 normal.  Exam reveals no gallop.   No murmur heard. Pulses:      Dorsalis pedis pulses are 1+ on the right side, and 1+ on the left side.  Respiratory: Currently on ventilator decreased breath sounds bilaterally .  GI: Soft. Bowel sounds are normal. There is no hepatosplenomegaly. There is no tenderness.  Musculoskeletal:       Right ankle: He exhibits swelling.       Left ankle: He exhibits swelling.  Lymphadenopathy:    He has no cervical adenopathy.  Neurological: Decreased responsiveness.  Skin: Skin is warm. No rash noted. Nails show no clubbing.  Psychiatric: Decreased responsive Data Reviewed: Basic Metabolic Panel:  Recent Labs Lab 08/21/14 0641 08/22/14 0423 08/23/14 [id number] 08/24/14 [id number] 08/24/14 0719 08/25/14 0555 08/26/14 0350 08/26/14 2039 08/27/14 0511  NA 141 143 142 143  --  146* 148*  --  146*  K 3.7 3.1*  3.9 3.5  --  3.3* 3.1* 4.1 3.8  CL 107 105 105 108  --  114* 118*  --  116*  CO2 27 26 24 24   --  22 27  --  26  GLUCOSE 124* 82 106* 120*  --  129* 136*  --  166*  BUN 20 23* 21* 21*  --  18 20  --  31*  CREATININE 0.97 0.99 0.93 0.76  --  0.78 0.71  --  0.98  CALCIUM 7.8* 8.0* 7.9* 7.9*  --  7.4* 7.9*  --  8.1*  MG 2.3 2.2  --   --  2.3 2.3 2.3  --  2.2  PHOS 3.9 3.0  --   --   --  3.0 2.5  --  3.1   Liver Function Tests:  Recent Labs Lab 08/21/14 0641 08/25/14 0555  AST 32 21  ALT 55 38  ALKPHOS 92 63  BILITOT 2.4* 1.5*  PROT 5.9* 5.2*  ALBUMIN 2.9* 2.2*   No results for input(s): LIPASE, AMYLASE in the last 168 hours. CBC:  Recent Labs Lab 08/22/14 0423 08/23/14 0736 08/24/14 0717 08/25/14 0555 08/26/14 0350  WBC  --  10.6 13.4* 7.9 6.1  HGB  --  13.6 14.1 11.6* 11.7*  HCT  --  41.7 43.7 35.9* 35.8*  MCV  --  93.0 93.9 94.3 94.0  PLT 294 256 286 242  249     Recent Results (from the past 240 hour(s))  Culture, blood (routine x 2)     Status: None   Collection Time: 08/10/2014  6:50 PM  Result Value Ref Range Status   Specimen Description BLOOD  Final   Special Requests NONE  Final   Culture NO GROWTH 6 DAYS  Final   Report Status 08/25/2014 FINAL  Final  Culture, blood (routine x 2)     Status: None   Collection Time: 08/12/2014  7:00 PM  Result Value Ref Range Status   Specimen Description BLOOD  Final   Special Requests NONE  Final   Culture NO GROWTH 6 DAYS  Final   Report Status 08/25/2014 FINAL  Final  Body fluid culture     Status: None   Collection Time: 08/20/14 11:54 AM  Result Value Ref Range Status   Specimen Description BILE  Final   Special Requests NONE  Final   Gram Stain FEW WBC SEEN NO ORGANISMS SEEN   Final   Culture NO GROWTH 3 DAYS  Final   Report Status 08/24/2014 FINAL  Final  Anaerobic culture     Status: None   Collection Time: 08/20/14 11:54 AM  Result Value Ref Range Status   Specimen Description GALL  Final   Special  Requests Normal  Final   Culture NO ANAEROBES ISOLATED  Final   Report Status 08/24/2014 FINAL  Final  Culture, bal-quantitative     Status: None (Preliminary result)   Collection Time: 08/24/14  2:45 PM  Result Value Ref Range Status   Specimen Description BRONCHIAL ALVEOLAR LAVAGE  Final   Special Requests Normal  Final   Gram Stain   Final    MODERATE WBC SEEN FEW YEAST RARE GRAM NEGATIVE RODS GOOD SPECIMEN - 80-90% WBCS    Culture RARE GROWTH YEAST IDENTIFICATION TO FOLLOW   Final   Report Status PENDING  Incomplete  MRSA PCR Screening     Status: None   Collection Time: 08/24/14  4:01 PM  Result Value Ref Range Status   MRSA by PCR NEGATIVE NEGATIVE Final    Comment:        The GeneXpert MRSA Assay (FDA approved for NASAL specimens only), is one component of a comprehensive MRSA colonization surveillance program. It is not intended to diagnose MRSA infection nor to guide or monitor treatment for MRSA infections.      Studies:   Scheduled Meds: . ampicillin-sulbactam (UNASYN) IV  3 g Intravenous Q6H  . budesonide (PULMICORT) nebulizer solution  0.5 mg Nebulization BID  . chlorhexidine  15 mL Mouth/Throat BID  . clonazePAM  0.5 mg Oral BID  . docusate  100 mg Oral BID  . enoxaparin (LOVENOX) injection  40 mg Subcutaneous Q24H  . famotidine  20 mg Oral BID  . feeding supplement (PRO-STAT SUGAR FREE 64)  30 mL Per Tube 5 X Daily  . feeding supplement (VITAL HIGH PROTEIN)  1,000 mL Per Tube Q24H  . free water  50 mL Per Tube Q1H  . gabapentin  200 mg Oral BID  . ipratropium-albuterol  3 mL Nebulization Q4H  . mirtazapine  22.5 mg Oral QHS  . multivitamin  5 mL Oral Daily  . OLANZapine  2.5 mg Oral QHS  . potassium chloride  20 mEq Oral BID  . QUEtiapine  12.5 mg Oral QHS  . sennosides  10 mL Oral BID  . sertraline  50 mg Oral Daily  . traZODone  50  mg Oral QHS    Assessment/Plan:  1. Acute respiratory failure with hypoxia; patient was reintubated after  ERCP. Extubated on 08/21/2014. CT scan of the chest shows bilateral pneumonia as well as effusions. Status post intubation on 08/24/14.  Bronchoscopy results with gram-negative bacteria, and some yeast. Continue Unasyn I will also start him on fluconazole.  2. Acute cholecystitis with Choledocholelithiasis; patient is status post ERCP with sphincterotomy and stones extracted. Patient is also status post interventional radiology drainage of gallbladder. Patient will likely go home with the gallbladder drain. Surgical team following. IV Unasyn. Surgery to consider cholecystectomy. 3. Elevated liver function tests; this is secondary to the acute cholecystitis and choledochollithiasis. improved 4. Polio, left sided weakness.  Unable to ambulate at this point. Has always used a brace for his left leg. 5. Back pain: Seen by Ortho Evra recommend courseset for the back pain has lumbar fractures which needs to medically be managed   Code Status:     Code Status Orders        Start     Ordered   22-Aug-2014 1430  Full code   Continuous     Aug 22, 2014 1433    Advance Directive Documentation        Most Recent Value   Type of Advance Directive  Healthcare Power of Attorney, Living will   Pre-existing out of facility DNR order (yellow form or pink MOST form)     "MOST" Form in Place?       Family Communication: Patient's wife at the bedsideexplained to her the plan of care  Time spent:    crictical care time [facility]  [facility]

## 2014-08-27 NOTE — Consult Note (Signed)
PULMONARY / CRITICAL CARE MEDICINE   Name: Alex Allison W Esh MRN: [id number] DOB: 1946/01/29    ADMISSION DATE:  08/27/2014     Patient re-intubated, will plan for SAT/SBt today,  central line placed,  s/p bronch showed purulent secretions, patient has failed wean attempt due to resp muscle fatigue I had extended discussion with family regarding plan of care-patients family has confirmed that he needs to be DNR.   Wean fio2 as tolerated fio2 at 30%  Plan for SAT/SBT  Wife/daughter/family  at bedside ECHO report noted  tinet  Pa  :  Past Medical History  Diagnosis Date  . Depressed   . Anxiety   . Polio   . Hypertension    Past Surgical History  Procedure Laterality Date  . Anal fissure repair     Prior to Admission medications   Medication Sig Start Date End Date Taking? Authorizing Provider  Cholecalciferol 2000 UNITS CAPS Take 1 capsule by mouth daily.   Yes Historical Provider, MD  Docusate Sodium 100 MG capsule Take 100 mg by mouth daily.   Yes Historical Provider, MD  gabapentin (NEURONTIN) 100 MG capsule Take 200 mg by mouth 2 (two) times daily.   Yes Historical Provider, MD  lisinopril (PRINIVIL,ZESTRIL) 20 MG tablet Take 20 mg by mouth every morning.   Yes Historical Provider, MD  Melatonin 5 MG TABS Take 1 tablet by mouth at bedtime.   Yes Historical Provider, MD  mirtazapine (REMERON) 45 MG tablet Take 22.5 mg by mouth at bedtime.   Yes Historical Provider, MD  OLANZapine (ZYPREXA) 2.5 MG tablet Take 2.5 mg by mouth at bedtime.   Yes Historical Provider, MD  sertraline (ZOLOFT) 50 MG tablet Take 50 mg by mouth daily.   Yes Historical Provider, MD  traZODone (DESYREL) 50 MG tablet Take 50 mg by mouth at bedtime.   Yes Historical Provider, MD  OLANZapine (ZYPREXA) 5 MG tablet Take 5 mg by mouth 2 (two) times daily.    Historical Provider, MD   No Known Allergies   Current facility-administered medications:  .  Ampicillin-Sulbactam (UNASYN) 3 g in sodium  chloride 0.9 % 100 mL IVPB, 3 g, Intravenous, Q6H, Lattie Hawichard E Cooper, MD, 3 g at 08/27/14 0353 .  budesonide (PULMICORT) nebulizer solution 0.5 mg, 0.5 mg, Nebulization, BID, Erin FullingKurian Darran Gabay, MD, 0.5 mg at 08/27/14 0752 .  chlorhexidine (PERIDEX) 0.12 % solution 15 mL, 15 mL, Mouth/Throat, BID, Enid Baasadhika Kalisetti, MD, 15 mL at 08/26/14 2147 .  clonazePAM (KLONOPIN) tablet 0.5 mg, 0.5 mg, Oral, BID, Erin FullingKurian Emmanuela Ghazi, MD, 0.5 mg at 08/26/14 2147 .  dexmedetomidine (PRECEDEX) 400 MCG/100ML (4 mcg/mL) infusion, 0.4-1.2 mcg/kg/hr, Intravenous, Titrated, Houston SirenVivek J Sainani, MD, Last Rate: 26.3 mL/hr at 08/27/14 0838, 1 mcg/kg/hr at 08/27/14 0838 .  docusate (COLACE) 50 MG/5ML liquid 100 mg, 100 mg, Oral, BID, Erin FullingKurian Oralia Criger, MD, 100 mg at 08/26/14 2146 .  enoxaparin (LOVENOX) injection 40 mg, 40 mg, Subcutaneous, Q24H, Erin FullingKurian Mame Twombly, MD, 40 mg at 08/26/14 2146 .  famotidine (PEPCID) tablet 20 mg, 20 mg, Oral, BID, Auburn BilberryShreyang Patel, MD, 20 mg at 08/26/14 2147 .  feeding supplement (PRO-STAT SUGAR FREE 64) liquid 30 mL, 30 mL, Per Tube, QID, Erin FullingKurian Kayleigh Broadwell, MD, 30 mL at 08/26/14 2147 .  feeding supplement (VITAL HIGH PROTEIN) liquid 1,000 mL, 1,000 mL, Per Tube, Q24H, Erin FullingKurian Daizee Firmin, MD, 1,000 mL at 08/26/14 1507 .  fentaNYL 2500mcg in NS 250mL (7910mcg/ml) infusion-PREMIX, 5 mcg/hr, Intravenous, Continuous, Erin FullingKurian Desirey Keahey, MD, Last Rate: 20 mL/hr at 08/27/14  0700, 200 mcg/hr at 08/27/14 0700 .  free water 50 mL, 50 mL, Per Tube, Q1H, Erin Fulling, MD, 50 mL at 08/27/14 0800 .  gabapentin (NEURONTIN) capsule 200 mg, 200 mg, Oral, BID, Natale Lay, MD, 200 mg at 08/26/14 2147 .  haloperidol lactate (HALDOL) injection 2 mg, 2 mg, Intravenous, Q4H PRN, Auburn Bilberry, MD, 2 mg at 08/23/14 2319 .  hydrALAZINE (APRESOLINE) injection 10 mg, 10 mg, Intravenous, Q6H PRN, Altamese Dilling, MD .  ipratropium-albuterol (DUONEB) 0.5-2.5 (3) MG/3ML nebulizer solution 3 mL, 3 mL, Nebulization, Q4H, Erin Fulling, MD, 3 mL at 08/27/14 0752 .  LORazepam  (ATIVAN) injection 1-2 mg, 1-2 mg, Intravenous, Q4H PRN, Altamese Dilling, MD, 2 mg at 08/26/14 1610 .  midazolam (VERSED) injection 2 mg, 2 mg, Intravenous, Q2H PRN, Erin Fulling, MD, 2 mg at 08/25/14 2147 .  mirtazapine (REMERON) tablet 22.5 mg, 22.5 mg, Oral, QHS, Natale Lay, MD, 22.5 mg at 08/26/14 2146 .  morphine 2 MG/ML injection 2 mg, 2 mg, Intravenous, Q2H PRN, Lattie Haw, MD, 2 mg at 08/26/14 [phone] .  norepinephrine (LEVOPHED) 4mg  in D5W premix infusion, 0-40 mcg/min, Intravenous, Titrated, Auburn Bilberry, MD, Last Rate: 37.5 mL/hr at 08/27/14 0700, 10 mcg/min at 08/27/14 0700 .  OLANZapine (ZYPREXA) tablet 2.5 mg, 2.5 mg, Oral, QHS, Natale Lay, MD, 2.5 mg at 08/26/14 2147 .  ondansetron (ZOFRAN) tablet 4 mg, 4 mg, Oral, Q6H PRN **OR** ondansetron (ZOFRAN) injection 4 mg, 4 mg, Intravenous, Q6H PRN, Lattie Haw, MD .  potassium chloride (KLOR-CON) packet 20 mEq, 20 mEq, Oral, BID, Erin Fulling, MD, 20 mEq at 08/26/14 2146 .  QUEtiapine (SEROQUEL) tablet 12.5 mg, 12.5 mg, Oral, QHS, Ida Rogue, MD, 12.5 mg at 08/26/14 2147 .  sennosides (SENOKOT) 8.8 MG/5ML syrup 10 mL, 10 mL, Oral, BID, Erin Fulling, MD, 10 mL at 08/26/14 2146 .  sertraline (ZOLOFT) tablet 50 mg, 50 mg, Oral, Daily, Ida Rogue, MD, 50 mg at 08/26/14 1032 .  traZODone (DESYREL) tablet 50 mg, 50 mg, Oral, QHS, Natale Lay, MD, 50 mg at 08/26/14 2149   FAMILY HISTORY   History reviewed. No pertinent family history.    SOCIAL HISTORY    reports that he has never smoked. He does not have any smokeless tobacco history on file. He reports that he does not drink alcohol or use illicit drugs.  Review of Systems  Unable to perform ROS: critical illness      VITAL SIGNS    Temp:  [97.9 F (36.6 C)-99 F (37.2 C)] 97.9 F (36.6 C) (05/26 0700) Pulse Rate:  [58-105] 58 (05/26 0800) Resp:  [11-18] 16 (05/26 0800) BP: (72-118)/(47-77) 113/59 mmHg (05/26 0800) SpO2:  [91 %-99 %]  96 % (05/26 0800) FiO2 (%):  [35 %] 35 % (05/26 0755) Weight:  [238 lb 8.6 oz (108.2 kg)] 238 lb 8.6 oz (108.2 kg) (05/26 0500) HEMODYNAMICS:   VENTILATOR SETTINGS: Vent Mode:  [-] PRVC FiO2 (%):  [35 %] 35 % Set Rate:  [16 bmp] 16 bmp Vt Set:  [550 mL] 550 mL PEEP:  [5 cmH20] 5 cmH20 INTAKE / OUTPUT:  Intake/Output Summary (Last 24 hours) at 08/27/14 0920 Last data filed at 08/27/14 0800  Gross per 24 hour  Intake 4253.45 ml  Output    770 ml  Net 3483.45 ml       PHYSICAL EXAM   Physical Exam  Constitutional: He appears well-developed. He appears distressed.  HENT:  Head: Normocephalic and atraumatic.  Eyes:  Pupils are equal, round, and reactive to light. No scleral icterus.  Neck: Normal range of motion. Neck supple.  Cardiovascular: Normal rate and regular rhythm.   No murmur heard. Pulmonary/Chest: He is in respiratory distress. He has no wheezes. He has rales.  resp distress  Abdominal: Soft. He exhibits distension. There is no tenderness.  t-tube in place  Musculoskeletal: He exhibits no edema.  Neurological: He displays normal reflexes. Coordination normal.  gcs<8T  Skin: Skin is warm. No rash noted. He is not diaphoretic.       LABS   LABS:  CBC  Recent Labs Lab 08/24/14 0717 08/25/14 0555 08/26/14 0350  WBC 13.4* 7.9 6.1  HGB 14.1 11.6* 11.7*  HCT 43.7 35.9* 35.8*  PLT 286 242 249   Coag's  Recent Labs Lab 08/20/14 1106  INR 1.01   BMET  Recent Labs Lab 08/25/14 0555 08/26/14 0350 08/26/14 2039 08/27/14 0511  NA 146* 148*  --  146*  K 3.3* 3.1* 4.1 3.8  CL 114* 118*  --  116*  CO2 22 27  --  26  BUN 18 20  --  31*  CREATININE 0.78 0.71  --  0.98  GLUCOSE 129* 136*  --  166*   Electrolytes  Recent Labs Lab 08/25/14 0555 08/26/14 0350 08/27/14 0511  CALCIUM 7.4* 7.9* 8.1*  MG 2.3 2.3 2.2  PHOS 3.0 2.5 3.1   Sepsis Markers  Recent Labs Lab 08/26/14 1400 08/26/14 2253  LATICACIDVEN 1.1 1.4   ABG  Recent  Labs Lab 08/24/14 0830 08/25/14 1105 08/26/14 1310  PHART 7.39 7.38 7.34*  PCO2ART 40 39 46  PO2ART 69* 64* 59*   Liver Enzymes  Recent Labs Lab 08/21/14 0641 08/25/14 0555  AST 32 21  ALT 55 38  ALKPHOS 92 63  BILITOT 2.4* 1.5*  ALBUMIN 2.9* 2.2*   Cardiac Enzymes No results for input(s): TROPONINI, PROBNP in the last 168 hours. Glucose  Recent Labs Lab 08/26/14 0719 08/26/14 1150 08/26/14 1705 08/26/14 1950 08/26/14 2347 08/27/14 0352  GLUCAP 152* 83 103* 127* 130* 147*     Recent Results (from the past 240 hour(s))  Culture, blood (routine x 2)     Status: None   Collection Time: 08/23/2014  6:50 PM  Result Value Ref Range Status   Specimen Description BLOOD  Final   Special Requests NONE  Final   Culture NO GROWTH 6 DAYS  Final   Report Status 08/25/2014 FINAL  Final  Culture, blood (routine x 2)     Status: None   Collection Time: 08/11/2014  7:00 PM  Result Value Ref Range Status   Specimen Description BLOOD  Final   Special Requests NONE  Final   Culture NO GROWTH 6 DAYS  Final   Report Status 08/25/2014 FINAL  Final  Body fluid culture     Status: None   Collection Time: 08/20/14 11:54 AM  Result Value Ref Range Status   Specimen Description BILE  Final   Special Requests NONE  Final   Gram Stain FEW WBC SEEN NO ORGANISMS SEEN   Final   Culture NO GROWTH 3 DAYS  Final   Report Status 08/24/2014 FINAL  Final  Anaerobic culture     Status: None   Collection Time: 08/20/14 11:54 AM  Result Value Ref Range Status   Specimen Description GALL  Final   Special Requests Normal  Final   Culture NO ANAEROBES ISOLATED  Final   Report Status 08/24/2014 FINAL  Final  Culture, bal-quantitative     Status: None (Preliminary result)   Collection Time: 08/24/14  2:45 PM  Result Value Ref Range Status   Specimen Description BRONCHIAL ALVEOLAR LAVAGE  Final   Special Requests Normal  Final   Gram Stain   Final    MODERATE WBC SEEN FEW YEAST RARE GRAM  NEGATIVE RODS GOOD SPECIMEN - 80-90% WBCS    Culture RARE GROWTH YEAST IDENTIFICATION TO FOLLOW   Final   Report Status PENDING  Incomplete  MRSA PCR Screening     Status: None   Collection Time: 08/24/14  4:01 PM  Result Value Ref Range Status   MRSA by PCR NEGATIVE NEGATIVE Final    Comment:        The GeneXpert MRSA Assay (FDA approved for NASAL specimens only), is one component of a comprehensive MRSA colonization surveillance program. It is not intended to diagnose MRSA infection nor to guide or monitor treatment for MRSA infections.       IMAGING    No results found.    Indwelling Urinary Catheter continued, requirement due to   Reason to continue Indwelling Urinary Catheter for strict Intake/Output monitoring for hemodynamic instability     Central line-continued for IV access  RASS goal -1 to -2           ASSESSMENT/PLAN    69 yo white male with post op hypoxic  resp failure likely  from acute hypoventilation with severe sepsis from acute cholecystitis and pneumonia  PULMONARY-resp failure-failed SAT/SBt due to resp muscle fatigue- -plan for SAT/SBT today -continue Bronchodilator Therapy -Wean Fio2 and PEEP as tolerated  Aspiration pneumonia -follow up BAL cultures -continue abx coverage  CARDIOVASCULAR -Needs ICU montioring -vasopressors as needed for septic shock  RENAL -watch UO -continue foley catheter  GASTROINTESTINAL S/p ERCP S/p  percutaneous GB drain   HEMATOLOGIC -watch h/h  INFECTIOUS -on iv unasyn for cholecystitis/aspiration pneumonia  ENDOCRINE -watch FSBS  NEUROLOGIC -wean off sedation  Patient now DNR.   I have personally obtained a history, examined the patient, evaluated laboratory and imaging results, formulated the assessment and plan and placed orders.  The Patient requires high complexity decision making for assessment and support, frequent evaluation and titration of therapies, application of  advanced monitoring technologies and extensive interpretation of multiple databases. Critical Care Time devoted to patient care services described in this note is 40 minutes.   Overall, patient is critically ill, prognosis is guarded. Patient at high risk  cardiac arrest and death.   Lucie Leather, M.D. Pulmonary & Critical Care Medicine [facility] Medical Director Intensive Care Unit   08/27/2014, 9:20 AM

## 2014-08-27 NOTE — Progress Notes (Addendum)
Performed sedation vacation this am- pt off fentanyl drip and on precedex at .5mcqs- patient became anxious and had increased work of breathing- he would follow commands by blinking his eyes and nodding on command when asked questions.  Dr. Belia HemanKasa aware and family at bedside.  Pt made DNR today.  Pallative care consulted.  Patient tolerating tube feeds.  Urine output adequate.  Pt NSR on monitor and vitals stable at this time- on 3 mcqs of levophed.

## 2014-08-27 NOTE — Progress Notes (Signed)
Nutrition Follow-up  DOCUMENTATION CODES:     INTERVENTION:   (Coordination of care: Recommend continuing vital high protein at 3940ml/hr and  prostat times 5 to meet 100% of kcals and protein needs. Recommend adding MVI to meet DRIs, current tube feeding rate not meeting 100% of DRIs.  ). Discussed with Dr. Belia HemanKasa during ICU rounds  NUTRITION DIAGNOSIS:  Inadequate oral intake related to inability to eat as evidenced by NPO status, ongoing.    GOAL:   (Goal would be for pt to continue to tolerate tube feeding at goal rate)    MONITOR:   (Energy Intake, Electrolyte and Renal Profile, Pulmonary Profile, Anthropometrics, Digestive system)  REASON FOR ASSESSMENT:   (follow-up)    ASSESSMENT:  Pt remains on vent.  Planning SAT/SBT today, tube feeding off currently, DNR status made.    Pt has been tolerating tube feeding at goal rate of 5340ml/hr with prostat times 3.  Free water flush at 150ml/hr  Electrolyte and Renal Profile:    Recent Labs Lab 08/25/14 0555 08/26/14 0350 08/26/14 2039 08/27/14 0511  BUN 18 20  --  31*  CREATININE 0.78 0.71  --  0.98  NA 146* 148*  --  146*  K 3.3* 3.1* 4.1 3.8  MG 2.3 2.3  --  2.2  PHOS 3.0 2.5  --  3.1   Medications: KCL, additional medications reviewed  Height:  Ht Readings from Last 1 Encounters:  08/20/14 5\' 11"  (1.803 m)    Weight:  Wt Readings from Last 1 Encounters:  08/27/14 238 lb 8.6 oz (108.2 kg)      Wt Readings from Last 10 Encounters:  08/27/14 238 lb 8.6 oz (108.2 kg)    BMI:  Body mass index is 33.28 kg/(m^2).  Estimated Nutritional Needs:  Kcal:  11-14 kcals/kg (Using actual wt of 102 kg) [id number] kcals/d  Protein:  (156-195 gm/d (2.0-2.5 gm/kg) Using IBW of 78kg  Fluid:  1950-236740ml/d (25-1730ml/kg) Using IBW of 78kg  Skin:  Reviewed, no issues  Diet Order:     EDUCATION NEEDS:  No education needs identified at this time   Intake/Output Summary (Last 24 hours) at 08/27/14 1035 Last  data filed at 08/27/14 1000  Gross per 24 hour  Intake 4218.45 ml  Output    770 ml  Net 3448.45 ml    Last BM: 5/26  HIGH Care Level Dorethy Tomey B. Freida BusmanAllen, RD, LDN [phone] (pager)

## 2014-08-27 NOTE — Consult Note (Signed)
Palliative Medicine Inpatient Consult Note   Name: Alex Allison Date: 08/27/2014 MRN: [id number]  DOB: January 31, 1946  Referring Physician: Auburn Bilberry, MD  Palliative Care consult requested for this 69 y.o. male for goals of medical therapy in patient with h/o polio and post-polio syndrome, admitted with acute cholecystitis, now with VDRF  Alex Allison is a 69 yo man with PMH of polio with LLE weakness/atrophy, post-polio syndrome, chronic pain, HTN, depression with h/o suicide attempt and psych admissions (11/2002 & 06/2013), osteoporosis with compression fx's (T-7 & L-1), diastolic dysfunction. He was admitted 08/15/2014 with acute cholecystitis. He had ERCP with sphincterotomy & stones extracted on 5/18 and developed acute respiratory failure post-extubation following the procedure requiring re-intubation. He was extubated 5/20 but had to be re-intubated 5/23. He remains on vent. He has also had a percutaneous cholecystostomy on 5/19. At present, pt is sedated on vent, on pressors. Family not present.   REVIEW OF SYSTEMS:  Patient is not able to provide ROS  SOCIAL HISTORY: Pt lives at home with his wife. He worked as a Doctor, hospital for [employer].   reports that he has never smoked. He does not have any smokeless tobacco history on file. He reports that he does not drink alcohol or use illicit drugs.  LEGAL DOCUMENTS:  Advance Directives:  Yes.  CODE STATUS: DNR  PAST MEDICAL HISTORY: Past Medical History  Diagnosis Date  . Depressed   . Anxiety   . Polio   . Hypertension     PAST SURGICAL HISTORY:  Past Surgical History  Procedure Laterality Date  . Anal fissure repair      ALLERGIES:  has No Known Allergies.  MEDICATIONS:  Current Facility-Administered Medications  Medication Dose Route Frequency Provider Last Rate Last Dose  . Ampicillin-Sulbactam (UNASYN) 3 g in sodium chloride 0.9 % 100 mL IVPB  3 g Intravenous Q6H Lattie Haw, MD   3 g at 08/27/14 1026  . budesonide  (PULMICORT) nebulizer solution 0.5 mg  0.5 mg Nebulization BID Erin Fulling, MD   0.5 mg at 08/27/14 0752  . chlorhexidine (PERIDEX) 0.12 % solution 15 mL  15 mL Mouth/Throat BID Enid Baas, MD   15 mL at 08/27/14 1031  . clonazePAM (KLONOPIN) tablet 0.5 mg  0.5 mg Oral BID Erin Fulling, MD   0.5 mg at 08/27/14 1027  . dexmedetomidine (PRECEDEX) 400 MCG/100ML (4 mcg/mL) infusion  0.4-1.2 mcg/kg/hr Intravenous Titrated Houston Siren, MD 26.3 mL/hr at 08/27/14 1400 1 mcg/kg/hr at 08/27/14 1400  . docusate (COLACE) 50 MG/5ML liquid 100 mg  100 mg Oral BID Erin Fulling, MD   100 mg at 08/27/14 1027  . enoxaparin (LOVENOX) injection 40 mg  40 mg Subcutaneous Q24H Erin Fulling, MD   40 mg at 08/26/14 2146  . famotidine (PEPCID) tablet 20 mg  20 mg Oral BID Auburn Bilberry, MD   20 mg at 08/27/14 1028  . feeding supplement (PRO-STAT SUGAR FREE 64) liquid 30 mL  30 mL Per Tube 5 X Daily Erin Fulling, MD   30 mL at 08/27/14 1302  . feeding supplement (VITAL HIGH PROTEIN) liquid 1,000 mL  1,000 mL Per Tube Q24H Erin Fulling, MD   1,000 mL at 08/27/14 1302  . fentaNYL in NS (25mcg/ml) infusion-PREMIX  5 mcg/hr Intravenous Continuous Erin Fulling, MD 20 mL/hr at 08/27/14 1200 200 mcg/hr at 08/27/14 1200  . free water 50 mL  50 mL Per Tube Q1H Erin Fulling, MD   50 mL at  08/27/14 1303  . gabapentin (NEURONTIN) capsule 200 mg  200 mg Oral BID Natale Lay, MD   200 mg at 08/27/14 1027  . haloperidol lactate (HALDOL) injection 2 mg  2 mg Intravenous Q4H PRN Auburn Bilberry, MD   2 mg at 08/23/14 2319  . hydrALAZINE (APRESOLINE) injection 10 mg  10 mg Intravenous Q6H PRN Altamese Dilling, MD      . ipratropium-albuterol (DUONEB) 0.5-2.5 (3) MG/3ML nebulizer solution 3 mL  3 mL Nebulization Q4H Erin Fulling, MD   3 mL at 08/27/14 1132  . LORazepam (ATIVAN) injection 1-2 mg  1-2 mg Intravenous Q4H PRN Altamese Dilling, MD   2 mg at 08/26/14 1610  . midazolam (VERSED) injection 2 mg  2 mg Intravenous  Q2H PRN Erin Fulling, MD   2 mg at 08/25/14 2147  . mirtazapine (REMERON) tablet 22.5 mg  22.5 mg Oral QHS Natale Lay, MD   22.5 mg at 08/26/14 2146  . morphine 2 MG/ML injection 2 mg  2 mg Intravenous Q2H PRN Lattie Haw, MD   2 mg at 08/26/14 9604  . multivitamin liquid 5 mL  5 mL Oral Daily Erin Fulling, MD   5 mL at 08/27/14 1036  . norepinephrine (LEVOPHED)  in D5W premix infusion  0-40 mcg/min Intravenous Titrated Auburn Bilberry, MD 18.8 mL/hr at 08/27/14 1400 5 mcg/min at 08/27/14 1400  . OLANZapine (ZYPREXA) tablet 2.5 mg  2.5 mg Oral QHS Natale Lay, MD   2.5 mg at 08/26/14 2147  . ondansetron (ZOFRAN) tablet 4 mg  4 mg Oral Q6H PRN Lattie Haw, MD       Or  . ondansetron [facility]) injection 4 mg  4 mg Intravenous Q6H PRN Lattie Haw, MD      . potassium chloride (KLOR-CON) packet 20 mEq  20 mEq Oral BID Erin Fulling, MD   20 mEq at 08/27/14 1027  . QUEtiapine (SEROQUEL) tablet 12.5 mg  12.5 mg Oral QHS Ida Rogue, MD   12.5 mg at 08/26/14 2147  . sennosides (SENOKOT) 8.8 MG/5ML syrup 10 mL  10 mL Oral BID Erin Fulling, MD   10 mL at 08/27/14 1027  . sertraline (ZOLOFT) tablet 50 mg  50 mg Oral Daily Ida Rogue, MD   50 mg at 08/27/14 1027  . traZODone (DESYREL) tablet 50 mg  50 mg Oral QHS Natale Lay, MD   50 mg at 08/26/14 2149    Vital Signs: BP 105/59 mmHg  Pulse 65  Temp(Src) 98.7 F (37.1 C) (Oral)  Resp 16  Ht  (1.803 m)  Wt 108.2 kg (238 lb 8.6 oz)  BMI 33.28 kg/m2  SpO2 95% Filed Weights   08/25/14 0601 08/26/14 0400 08/27/14 0500  Weight: 105.2 kg (231 lb 14.8 oz) 105 kg (231 lb 7.7 oz) 108.2 kg (238 lb 8.6 oz)    Estimated body mass index is 33.28 kg/(m^2) as calculated from the following:   Height as of this encounter:  (1.803 m).   Weight as of this encounter: 108.2 kg (238 lb 8.6 oz).   PHYSICAL EXAM: General: Critically ill appearing HEENT: ETT/NGT in place Neck: Trachea midline  Cardiovascular: regular  rate and rhythm Pulmonary/Chest:good air movemnt ant fields, no audible wheeze Abdomen: Soft, hypoactive bowel sounds GU: Foley present, dark urine in bag Extremities: LLE shortened, atrophied Neurological: unable to assess Skin: no rashes Psychiatric: sedated  LABS: CBC:  Recent Labs Lab 08/25/14 0555 08/26/14 0350  WBC 7.9 6.1  HGB  11.6* 11.7*  HCT 35.9* 35.8*  PLT 242 249   Comprehensive Metabolic Panel:  Recent Labs Lab 08/21/14 0641  08/25/14 0555 08/26/14 0350 08/26/14 2039 08/27/14 0511  NA 141  < > 146* 148*  --  146*  K 3.7  < > 3.3* 3.1* 4.1 3.8  CL 107  < > 114* 118*  --  116*  CO2 27  < > 22 27  --  26  GLUCOSE 124*  < > 129* 136*  --  166*  BUN 20  < > 18 20  --  31*  CREATININE 0.97  < > 0.78 0.71  --  0.98  CALCIUM 7.8*  < > 7.4* 7.9*  --  8.1*  MG 2.3  < > 2.3 2.3  --  2.2  AST 32  --  21  --   --   --   ALT 55  --  38  --   --   --   ALKPHOS 92  --  63  --   --   --   BILITOT 2.4*  --  1.5*  --   --   --   < > = values in this interval not displayed.  IMPRESSION: Alex Joanne GavelSutton is a 69 yo man with PMH of polio with LLE weakness/atrophy, post-polio syndrome, chronic pain, HTN, depression with h/o suicide attempt and psych admissions (11/2002 & 06/2013), osteoporosis with compression fx's (T-7 & L-1), diastolic dysfunction. He was admitted 08/26/2014 with acute cholecystitis. He had ERCP with sphincterotomy & stones extracted on 5/18 and developed acute respiratory failure post-extubation following the procedure requiring re-intubation. He was extubated 5/20 but had to be re-intubated 5/23. He remains on vent. He has also had a percutaneous cholecystostomy on 5/19. At present, pt is sedated on vent, on pressors. Family not present.  Discussed with Dr Belia HemanKasa. Pt had bronch 5/23 with finding of copious purulent secretions. Pt not yet ready for extubation. Total days on vent 6. Will meet with family when available to address goals of therapy.   PLAN: Family meeting  when family available   More than 50% of the visit was spent in counseling/coordination of care: YES  Time spent: 70 minutes

## 2014-08-28 DIAGNOSIS — R531 Weakness: Secondary | ICD-10-CM

## 2014-08-28 DIAGNOSIS — K819 Cholecystitis, unspecified: Secondary | ICD-10-CM

## 2014-08-28 DIAGNOSIS — Z66 Do not resuscitate: Secondary | ICD-10-CM

## 2014-08-28 LAB — URINALYSIS COMPLETE WITH MICROSCOPIC (ARMC ONLY)
BACTERIA UA: NONE SEEN
Bilirubin Urine: NEGATIVE
GLUCOSE, UA: NEGATIVE mg/dL
Ketones, ur: NEGATIVE mg/dL
NITRITE: NEGATIVE
PH: 5 (ref 5.0–8.0)
PROTEIN: 30 mg/dL — AB
SQUAMOUS EPITHELIAL / LPF: NONE SEEN
Specific Gravity, Urine: 1.031 — ABNORMAL HIGH (ref 1.005–1.030)

## 2014-08-28 LAB — GLUCOSE, CAPILLARY
Glucose-Capillary: 130 mg/dL — ABNORMAL HIGH (ref 65–99)
Glucose-Capillary: 126 mg/dL — ABNORMAL HIGH (ref 65–99)
Glucose-Capillary: 127 mg/dL — ABNORMAL HIGH (ref 65–99)

## 2014-08-28 LAB — BASIC METABOLIC PANEL
Anion gap: 4 — ABNORMAL LOW (ref 5–15)
BUN: 33 mg/dL — AB (ref 6–20)
CALCIUM: 8.2 mg/dL — AB (ref 8.9–10.3)
CO2: 28 mmol/L (ref 22–32)
CREATININE: 0.95 mg/dL (ref 0.61–1.24)
Chloride: 112 mmol/L — ABNORMAL HIGH (ref 101–111)
GFR calc non Af Amer: >60 mL/min (ref >60)
Glucose, Bld: 154 mg/dL — ABNORMAL HIGH (ref 65–99)
POTASSIUM: 4.4 mmol/L (ref 3.5–5.1)
SODIUM: 144 mmol/L (ref 135–145)

## 2014-08-28 LAB — CULTURE, BAL-QUANTITATIVE: SPECIAL REQUESTS: NORMAL

## 2014-08-28 LAB — CULTURE, BAL-QUANTITATIVE W GRAM STAIN

## 2014-08-28 MED ORDER — LORAZEPAM 2 MG/ML IJ SOLN: 1.0000 mg | INTRAMUSCULAR | Status: DC | PRN

## 2014-08-28 MED ORDER — GLYCOPYRROLATE 0.2 MG/ML IJ SOLN: 0.2000 mg | INTRAMUSCULAR | Status: DC | PRN

## 2014-08-28 MED ORDER — GLYCOPYRROLATE 1 MG PO TABS
1.0000 mg | ORAL_TABLET | ORAL | Status: DC | PRN
  Filled 2014-08-28: qty 1

## 2014-08-28 MED ORDER — LORAZEPAM 2 MG/ML IJ SOLN
INTRAMUSCULAR | Status: AC
  Administered 2014-08-28: 2 mg
  Filled 2014-08-28: qty 1

## 2014-08-28 MED ORDER — FLUCONAZOLE 100MG IVPB
100.0000 mg | Freq: Once | INTRAVENOUS | Status: AC
  Administered 2014-08-28: 100 mg via INTRAVENOUS
  Filled 2014-08-28: qty 50

## 2014-08-28 MED ORDER — MORPHINE SULFATE 4 MG/ML IJ SOLN
4.0000 mg | Freq: Once | INTRAMUSCULAR | Status: AC
  Administered 2014-08-28: 4 mg via INTRAVENOUS

## 2014-08-28 MED ORDER — LORAZEPAM 2 MG/ML IJ SOLN: 1.0000 mg | INTRAMUSCULAR | Status: DC

## 2014-08-28 MED ORDER — VANCOMYCIN HCL 10 G IV SOLR
1500.0000 mg | Freq: Once | INTRAVENOUS | Status: DC
  Filled 2014-08-28: qty 1500

## 2014-08-28 MED ORDER — CEFEPIME HCL 2 G IJ SOLR
2.0000 g | Freq: Three times a day (TID) | INTRAMUSCULAR | Status: DC
  Filled 2014-08-28 (×4): qty 2

## 2014-08-28 MED ORDER — ACETAMINOPHEN 325 MG PO TABS
650.0000 mg | ORAL_TABLET | Freq: Four times a day (QID) | ORAL | Status: DC | PRN
  Administered 2014-08-28: 650 mg via ORAL
  Filled 2014-08-28: qty 2

## 2014-08-28 MED ORDER — LORAZEPAM 0.5 MG PO TABS: 1.0000 mg | ORAL_TABLET | ORAL | Status: DC | PRN

## 2014-08-28 MED ORDER — FLUCONAZOLE 100MG IVPB
100.0000 mg | INTRAVENOUS | Status: DC
  Administered 2014-08-28: 100 mg via INTRAVENOUS
  Filled 2014-08-28 (×2): qty 50

## 2014-08-28 MED ORDER — LORAZEPAM 2 MG/ML IJ SOLN
1.0000 mg | INTRAMUSCULAR | Status: DC | PRN
  Administered 2014-08-28: 2 mg via INTRAVENOUS
  Filled 2014-08-28: qty 1

## 2014-08-28 MED ORDER — MORPHINE SULFATE 4 MG/ML IJ SOLN
4.0000 mg | INTRAMUSCULAR | Status: DC | PRN
  Administered 2014-08-28: 4 mg via INTRAVENOUS
  Filled 2014-08-28 (×2): qty 1

## 2014-08-28 MED ORDER — ACETAMINOPHEN 325 MG PO TABS: 650.0000 mg | ORAL_TABLET | Freq: Four times a day (QID) | ORAL | Status: DC | PRN

## 2014-08-28 MED ORDER — SODIUM CHLORIDE 0.9 % IV SOLN: 5.0000 mg/h | INTRAVENOUS | Status: DC

## 2014-08-28 MED ORDER — FUROSEMIDE 10 MG/ML IJ SOLN
20.0000 mg | Freq: Two times a day (BID) | INTRAMUSCULAR | Status: DC
  Administered 2014-08-28: 20 mg via INTRAVENOUS
  Filled 2014-08-28: qty 2

## 2014-08-28 MED ORDER — LORAZEPAM 2 MG/ML PO CONC: 1.0000 mg | ORAL | Status: DC | PRN

## 2014-08-28 MED ORDER — ACETAMINOPHEN 650 MG RE SUPP: 650.0000 mg | Freq: Four times a day (QID) | RECTAL | Status: DC | PRN

## 2014-08-28 MED ORDER — MORPHINE 100MG IN NS 100ML (1MG/ML) PREMIX INFUSION: 5.0000 mg/h | INTRAVENOUS | Status: DC

## 2014-09-02 NOTE — Consult Note (Signed)
Palliative Medicine Inpatient Consult Note   Name: Alex Allison Date: September 22, 2014 MRN: [id number]  DOB: 27-May-1945  Referring Physician: Dustin Flock, MD  Palliative Care consult requested for this 69 y.o. male for goals of medical therapy in patient with h/o polio and post-polio syndrome, admitted with acute cholecystitis, now with VDRF  [name] is intubated, sedated. Unable to perform SBT this AM.   REVIEW OF SYSTEMS:  Patient is not able to provide ROS .  SOCIAL HISTORY:  reports that he has never smoked. He does not have any smokeless tobacco history on file. He reports that he does not drink alcohol or use illicit drugs.   CODE STATUS: DNR  PAST MEDICAL HISTORY: Past Medical History  Diagnosis Date  . Depressed   . Anxiety   . Polio   . Hypertension     PAST SURGICAL HISTORY:  Past Surgical History  Procedure Laterality Date  . Anal fissure repair      ALLERGIES:  is allergic to vecuronium.  MEDICATIONS:  Current Facility-Administered Medications  Medication Dose Route Frequency Provider Last Rate Last Dose  . acetaminophen (TYLENOL) tablet 650 mg  650 mg Oral Q6H PRN Grayland Jack Lenah Messenger, MD       Or  . acetaminophen (TYLENOL) suppository 650 mg  650 mg Rectal Q6H PRN Grayland Jack Meaghann Choo, MD      . fentaNYL 2540mg in NS 2566m(1033mml) infusion-PREMIX  5 mcg/hr Intravenous Continuous KurFlora LippsD 10 mL/hr at 08/07/19/1600 100 mcg/hr at 08/2014-09-2098  . furosemide (LASIX) injection 20 mg  20 mg Intravenous Q12H ShrDustin FlockD   20 mg at 05/06-21-1634  . glycopyrrolate (ROBINUL) tablet 1 mg  1 mg Oral Q4H PRN NanGrayland Jackifer, MD       Or  . glycopyrrolate (ROBINUL) injection 0.2 mg  0.2 mg Subcutaneous Q4H PRN NanGrayland Jackifer, MD       Or  . glycopyrrolate (ROBINUL) injection 0.2 mg  0.2 mg Intravenous Q4H PRN NanGrayland Jackifer, MD      . haloperidol lactate (HALDOL) injection 2 mg  2 mg Intravenous Q4H PRN ShrDustin FlockD   2 mg at 08/23/14 2319  .  LORazepam (ATIVAN) tablet 1 mg  1 mg Oral Q4H PRN NanGrayland Jackifer, MD       Or  . LORazepam (ATIVAN) 2 MG/ML concentrated solution 1 mg  1 mg Sublingual Q4H PRN NanGrayland Jackifer, MD       Or  . LORazepam (ATIVAN) injection 1 mg  1 mg Intravenous Q1H PRN NanGrayland Jackifer, MD      . morphine 100m48m NS 100mL7mg/m33minfusion - premix  5 mg/hr Intravenous Continuous Viridiana Spaid Grayland Jackr, MD      . morphine 4 MG/ML injection 4 mg  4 mg Intravenous Q30 min PRN Jaimes Eckert Grayland Jackr, MD   4 mg at 08/27/04-21-16 . morphine 4 MG/ML injection 4 mg  4 mg Intravenous Once Jermarcus Mcfadyen Grayland Jackr, MD        Vital Signs: BP 117/62 mmHg  Pulse 77  Temp(Src) 98.4 F (36.9 C) (Oral)  Resp 18  Ht _0  (1.803 m)  Wt 111.3 kg (245 lb 6 oz)  BMI 34.24 kg/m2  SpO2 96% Filed Weights   08/26/14 0400 08/27/14 0500 08/27/04-21-16 Weight: 105 kg (231 lb 7.7 oz) 108.2 kg (238 lb 8.6 oz) 111.3 kg (245 lb 6 oz)    Estimated body mass index  is 34.24 kg/(m^2) as calculated from the following:   Height as of this encounter: _0  (1.803 m).   Weight as of this encounter: 111.3 kg (245 lb 6 oz).  PERFORMANCE STATUS (ECOG) : 3 - Symptomatic, >50% confined to bed  PHYSICAL EXAM: General: Critically ill appearing HEENT: ETT/NGT in place Neck: Trachea midline  Cardiovascular: regular rate and rhythm Pulmonary/Chest:good air movemnt ant fields, no audible wheeze Abdomen: Soft, hypoactive bowel sounds GU: Foley present, dark urine in bag Extremities: LLE shortened, atrophied, + edema Neurological: unable to assess Skin: no rashes Psychiatric: sedated  LABS: CBC:  Recent Labs Lab 08/25/14 0555 08/26/14 0350  WBC 7.9 6.1  HGB 11.6* 11.7*  HCT 35.9* 35.8*  PLT 242 249   Comprehensive Metabolic Panel:  Recent Labs Lab 08/25/14 0555 08/26/14 0350  08/27/14 0511 Aug 31, 2014 1412  NA 146* 148*  --  146* 144  K 3.3* 3.1*  < > 3.8 4.4  CL 114* 118*  --  116* 112*  CO2 22 27  --  26 28  GLUCOSE 129* 136*  --  166* 154*   BUN 18 20  --  31* 33*  CREATININE 0.78 0.71  --  0.98 0.95  CALCIUM 7.4* 7.9*  --  8.1* 8.2*  MG 2.3 2.3  --  2.2  --   AST 21  --   --   --   --   ALT 38  --   --   --   --   ALKPHOS 63  --   --   --   --   BILITOT 1.5*  --   --   --   --   < > = values in this interval not displayed.  IMPRESSION:  [name] is a 69 yo man with PMH of polio with LLE weakness/atrophy, post-polio syndrome, chronic pain, HTN, depression with h/o suicide attempt and psych admissions (11/2002 & 06/2013), osteoporosis with compression fx's (T-7 & L-1), diastolic dysfunction. He was admitted 08/22/2014 with acute cholecystitis. He had ERCP with sphincterotomy & stones extracted on 5/18 and developed acute respiratory failure post-extubation following the procedure requiring re-intubation. He was extubated 5/20 but had to be re-intubated 5/23. He remains on vent. He has also had a percutaneous cholecystostomy on 5/19.  I met with pt's wife, son and daughter. Updated them on pt's current condition. Discussed plan to continue current tx and see if pt responds to further abx tx. Family met alone and then again with me. They all feel that pt is suffering. They want pt to be extubated to comfort care only. Family all in agreement that this is what pt would want. Orders entered. Pt extubated. I was present for extubation. Family now at bedside.   PLAN: 1. Extubate 2. Comfort care  REFERRALS TO BE ORDERED:  Chaplain   More than 50% of the visit was spent in counseling/coordination of care: YES  Time spent: 60 minutes

## 2014-09-02 NOTE — Progress Notes (Signed)
Pt is eligible for tissue donation. Nurse supervisor aware

## 2014-09-02 NOTE — Progress Notes (Signed)
Patient is placed on high fowlers position, cuff deflated, suctioned orally and endotracheally and then extubated to 2 lpm o2 [place]. Dr. Harvie JuniorPhifer at bedside

## 2014-09-02 NOTE — Addendum Note (Signed)
Addendum  created Oct 27, 2014 1032 by Yevette EdwardsJames G Sherlynn Tourville, MD   Modules edited: Anesthesia Events, Narrator   Narrator:  Narrator: Event Log Edited

## 2014-09-02 NOTE — Progress Notes (Signed)
[facility] PROGRESS NOTE  Dyke Bracketthomas W Trow VHQ:[id number] DOB: 25-Mar-1946 DOA: 08/15/2014 PCP: Corky DownsMASOUD,JAVED, MD  HPI/Subjective: Patient has fever earlier today, antibiotics stopped by Dr. Belia HemanKasa, pt having swelling in lower ext and hands  Objective: Filed Vitals:   11/27/14 1100  BP: 105/61  Pulse: 67  Temp: 101 F (38.3 C)  Resp: 16    Intake/Output Summary (Last 24 hours) at 11/27/14 1137 Last data filed at 11/27/14 1100  Gross per 24 hour  Intake 3190.55 ml  Output   1180 ml  Net 2010.55 ml   Filed Weights   08/26/14 0400 08/27/14 0500 11/27/14 0500  Weight: 105 kg (231 lb 7.7 oz) 108.2 kg (238 lb 8.6 oz) 111.3 kg (245 lb 6 oz)    ROS: Review of Systems   unable to give any review of systems due intubation   Exam: Physical Exam  General: Critically ill-appearing male ,  HENT:  Nose: No mucosal edema.  Eyes: Conjunctivae and lids are normal. Pupils are equal, round, and reactive to light.  Neck: No JVD present. Carotid bruit is not present. No edema present. No thyroid mass and no thyromegaly present.  Cardiovascular: Regular rhythm, S1 normal and S2 normal.  Exam reveals no gallop.   No murmur heard. Pulses:      Dorsalis pedis pulses are 1+ on the right side, and 1+ on the left side.  Respiratory: Currently on ventilator decreased breath sounds bilaterally .  GI: Soft. Bowel sounds are normal. There is no hepatosplenomegaly. There is no tenderness.  Musculoskeletal:       Right ankle: He exhibits 2+ swelling.       Left ankle: He exhibits 2+swelling.  Lymphadenopathy:    He has no cervical adenopathy.  Neurological: Decreased responsiveness.  Skin: Skin is warm. No rash noted. Nails show no clubbing.  Psychiatric: Decreased responsive Data Reviewed: Basic Metabolic Panel:  Recent Labs Lab 08/22/14 0423 08/23/14 0736 08/24/14 0717 08/24/14 0719 08/25/14 0555 08/26/14 0350 08/26/14 2039 08/27/14 0511  NA 143 142 143  --  146* 148*  --   146*  K 3.1* 3.9 3.5  --  3.3* 3.1* 4.1 3.8  CL 105 105 108  --  114* 118*  --  116*  CO2 26 24 24   --  22 27  --  26  GLUCOSE 82 106* 120*  --  129* 136*  --  166*  BUN 23* 21* 21*  --  18 20  --  31*  CREATININE 0.99 0.93 0.76  --  0.78 0.71  --  0.98  CALCIUM 8.0* 7.9* 7.9*  --  7.4* 7.9*  --  8.1*  MG 2.2  --   --  2.3 2.3 2.3  --  2.2  PHOS 3.0  --   --   --  3.0 2.5  --  3.1   Liver Function Tests:  Recent Labs Lab 08/25/14 0555  AST 21  ALT 38  ALKPHOS 63  BILITOT 1.5*  PROT 5.2*  ALBUMIN 2.2*   No results for input(s): LIPASE, AMYLASE in the last 168 hours. CBC:  Recent Labs Lab 08/22/14 0423 08/23/14 0736 08/24/14 0717 08/25/14 0555 08/26/14 0350  WBC  --  10.6 13.4* 7.9 6.1  HGB  --  13.6 14.1 11.6* 11.7*  HCT  --  41.7 43.7 35.9* 35.8*  MCV  --  93.0 93.9 94.3 94.0  PLT 294 256 286 242 249     Recent Results (from the past 240 hour(s))  Culture, blood (routine x 2)     Status: None   Collection Time: 08/31/14  6:50 PM  Result Value Ref Range Status   Specimen Description BLOOD  Final   Special Requests NONE  Final   Culture NO GROWTH 6 DAYS  Final   Report Status 08/25/2014 FINAL  Final  Culture, blood (routine x 2)     Status: None   Collection Time: 2014-08-31  7:00 PM  Result Value Ref Range Status   Specimen Description BLOOD  Final   Special Requests NONE  Final   Culture NO GROWTH 6 DAYS  Final   Report Status 08/25/2014 FINAL  Final  Body fluid culture     Status: None   Collection Time: 08/20/14 11:54 AM  Result Value Ref Range Status   Specimen Description BILE  Final   Special Requests NONE  Final   Gram Stain FEW WBC SEEN NO ORGANISMS SEEN   Final   Culture NO GROWTH 3 DAYS  Final   Report Status 08/24/2014 FINAL  Final  Anaerobic culture     Status: None   Collection Time: 08/20/14 11:54 AM  Result Value Ref Range Status   Specimen Description GALL  Final   Special Requests Normal  Final   Culture NO ANAEROBES ISOLATED  Final    Report Status 08/24/2014 FINAL  Final  Culture, bal-quantitative     Status: None   Collection Time: 08/24/14  2:45 PM  Result Value Ref Range Status   Specimen Description BRONCHIAL ALVEOLAR LAVAGE  Final   Special Requests Normal  Final   Gram Stain   Final    MODERATE WBC SEEN FEW YEAST RARE GRAM NEGATIVE RODS GOOD SPECIMEN - 80-90% WBCS    Culture RARE GROWTH CANDIDA ALBICANS  Final   Report Status 08/27/2014 FINAL  Final  MRSA PCR Screening     Status: None   Collection Time: 08/24/14  4:01 PM  Result Value Ref Range Status   MRSA by PCR NEGATIVE NEGATIVE Final    Comment:        The GeneXpert MRSA Assay (FDA approved for NASAL specimens only), is one component of a comprehensive MRSA colonization surveillance program. It is not intended to diagnose MRSA infection nor to guide or monitor treatment for MRSA infections.      Studies:   Scheduled Meds: . budesonide (PULMICORT) nebulizer solution  0.5 mg Nebulization BID  . chlorhexidine  15 mL Mouth/Throat BID  . clonazePAM  0.5 mg Oral BID  . docusate  100 mg Oral BID  . enoxaparin (LOVENOX) injection  40 mg Subcutaneous Q24H  . famotidine  20 mg Oral BID  . feeding supplement (PRO-STAT SUGAR FREE 64)  30 mL Per Tube 5 X Daily  . feeding supplement (VITAL HIGH PROTEIN)  1,000 mL Per Tube Q24H  . fluconazole (DIFLUCAN) IV  100 mg Intravenous Q24H  . free water  50 mL Per Tube Q1H  . furosemide  20 mg Intravenous Q12H  . gabapentin  200 mg Oral BID  . ipratropium-albuterol  3 mL Nebulization Q4H  . mirtazapine  22.5 mg Oral QHS  . multivitamin  5 mL Oral Daily  . OLANZapine  2.5 mg Oral QHS  . potassium chloride  20 mEq Oral BID  . QUEtiapine  12.5 mg Oral QHS  . sertraline  50 mg Oral Daily  . traZODone  50 mg Oral QHS    Assessment/Plan:  1. Acute respiratory failure with hypoxia; patient was reintubated  after ERCP. Extubated on 08/21/2014. CT scan of the chest shows bilateral pneumonia as well as  effusions. Status post intubation on 08/24/14.  Bronchoscopy results with gram-negative bacteria, and some yeast. Status post treatment with anabiotic's for 10 days. Patient also on fluconazole due to positive yeast in the sputum. start iv lasix to due to fluid retention. 2. Acute cholecystitis with Choledocholelithiasis; patient is status post ERCP with sphincterotomy and stones extracted. Patient is also status post interventional radiology drainage of gallbladder. Patient will likely go home with the gallbladder drain. Surgical team following. IV Unasyn. Surgery to consider cholecystectomy. 3. Elevated liver function tests; this is secondary to the acute cholecystitis and choledochollithiasis. improved 4. Polio, left sided weakness.  Unable to ambulate at this point. Has always used a brace for his left leg. 5. Back pain: Seen by Ortho Evra recommend courseset for the back pain has lumbar fractures which needs to medically be managed 6. Fever blood cx, stop abx, no diarrhea to suspect c.diff   Code Status:     Code Status Orders        Start     Ordered   09/03/2014 1430  Full code   Continuous     2014-09-03 1433    Advance Directive Documentation        Most Recent Value   Type of Advance Directive  Healthcare Power of Attorney, Living will   Pre-existing out of facility DNR order (yellow form or pink MOST form)     "MOST" Form in Place?       Family Communication: Patient's wife at the bedsideexplained to her the plan of care  Time spent:    crictical care time [facility]  [facility]

## 2014-09-02 NOTE — Progress Notes (Signed)
   Jul 20, 2014 1700  Clinical Encounter Type  Visited With Family  Visit Type Spiritual support  Referral From Nurse  Consult/Referral To Chaplain  Spiritual Encounters  Spiritual Needs Grief support  Stress Factors  Patient Stress Factors Health changes  Family Stress Factors Health changes  Advance Directives (For Healthcare)  Does patient have an advance directive? Yes  Type of Advance Directive Healthcare Power of Thea Alkenttorney   Chaplain provided grief support to family, therapeutic presence, empathic listening and emotional support.   AD [phone]

## 2014-09-02 NOTE — Progress Notes (Signed)
Mr. Joanne GavelSutton expired this evening at 1636. Pt family decided to extubate patient and make him comfort care. Pronounced dead by Julio AlmKendra Poteat, RN, and Blima RichPam Myers, RN. No cardiac heart tones heard by auscultation. NO respirations. Pt removed from ventilator at 1535. Family at bedside at time of death. Dr. Allena KatzPatel aware of time of death.  [place] donor services contacted. Referral number of [id number]. Coordinator name: Bethann BerkshireJohnny.

## 2014-09-02 NOTE — Progress Notes (Signed)
Nutrition Follow-up  DOCUMENTATION CODES:     INTERVENTION:   (Coordination of care: recommend continuing vital high protein at 7740ml/hr with prostat times 5 to meet 100% of kcals and protein needs. Dr Belia HemanKasa agreeable to checking BMP for today to monitor Na). Dr Belia HemanKasa agreeable to checking BMP today to determine Na level  NUTRITION DIAGNOSIS:  Inadequate oral intake related to inability to eat as evidenced by NPO status, ongoing but being addressed with tube feeding.    GOAL:   (Goal would be for pt to continue to tolerate tube feeding at goal rate)    MONITOR:   (Energy Intake, Electrolyte and Renal Profile, Pulmonary Profile, Anthropometrics, Digestive system)  REASON FOR ASSESSMENT:   (follow-up)    ASSESSMENT:  Pt remains on vent, temperature this am  Medications: reviewed Labs: reviewed  Height:  Ht Readings from Last 1 Encounters:  08/20/14 5\' 11"  (1.803 m)    Weight:  Wt Readings from Last 1 Encounters:  07/01/14 245 lb 6 oz (111.3 kg)    Ideal Body Weight:     Wt Readings from Last 10 Encounters:  07/01/14 245 lb 6 oz (111.3 kg)    BMI:  Body mass index is 34.24 kg/(m^2).  Estimated Nutritional Needs:  Kcal:  11-14 kcals/kg (Using actual wt of 102 kg) [id number] kcals/d  Protein:  (156-195 gm/d (2.0-2.5 gm/kg) Using IBW of 78kg  Fluid:  1950-238440ml/d (25-6930ml/kg) Using IBW of 78kg  Skin:  Reviewed, no issues  Diet Order:     EDUCATION NEEDS:  No education needs identified at this time   Intake/Output Summary (Last 24 hours) at 07/01/14 1244 Last data filed at 07/01/14 1100  Gross per 24 hour  Intake 3044.25 ml  Output   1180 ml  Net 1864.25 ml    Last BM:  5/25  HIGH Care Level Alex Allison B. Freida BusmanAllen, RD, LDN [phone] (pager)

## 2014-09-02 NOTE — Consult Note (Signed)
PULMONARY / CRITICAL CARE MEDICINE   Name: Alex Allison MRN: [id number] DOB: 05-Jul-1945    ADMISSION DATE:  08/29/2014     Patient re-intubated, patient has failed multiple SAT/SBt's due to severe resp. muscle fatigue-family at bedside.   I had extended discussion with family regarding plan of care-patients family has confirmed that he needs to be DNR. Palliative care consulted   Wean fio2 as tolerated fio2 at 30%  Will continue SAT/SBT's daily tinet  Pa  :  Past Medical History  Diagnosis Date  . Depressed   . Anxiety   . Polio   . Hypertension    Past Surgical History  Procedure Laterality Date  . Anal fissure repair     Prior to Admission medications   Medication Sig Start Date End Date Taking? Authorizing Provider  Cholecalciferol 2000 UNITS CAPS Take 1 capsule by mouth daily.   Yes Historical Provider, MD  Docusate Sodium 100 MG capsule Take 100 mg by mouth daily.   Yes Historical Provider, MD  gabapentin (NEURONTIN) 100 MG capsule Take 200 mg by mouth 2 (two) times daily.   Yes Historical Provider, MD  lisinopril (PRINIVIL,ZESTRIL) 20 MG tablet Take 20 mg by mouth every morning.   Yes Historical Provider, MD  Melatonin 5 MG TABS Take 1 tablet by mouth at bedtime.   Yes Historical Provider, MD  mirtazapine (REMERON) 45 MG tablet Take 22.5 mg by mouth at bedtime.   Yes Historical Provider, MD  OLANZapine (ZYPREXA) 2.5 MG tablet Take 2.5 mg by mouth at bedtime.   Yes Historical Provider, MD  sertraline (ZOLOFT) 50 MG tablet Take 50 mg by mouth daily.   Yes Historical Provider, MD  traZODone (DESYREL) 50 MG tablet Take 50 mg by mouth at bedtime.   Yes Historical Provider, MD  OLANZapine (ZYPREXA) 5 MG tablet Take 5 mg by mouth 2 (two) times daily.    Historical Provider, MD   No Known Allergies   Current facility-administered medications:  .  Ampicillin-Sulbactam (UNASYN) 3 g in sodium chloride 0.9 % 100 mL IVPB, 3 g, Intravenous, Q6H, Lattie Hawichard E Cooper, MD, 3 g  at 08/24/2014 0405 .  budesonide (PULMICORT) nebulizer solution 0.5 mg, 0.5 mg, Nebulization, BID, Erin FullingKurian Clementina Mareno, MD, 0.5 mg at 08/20/2014 0743 .  chlorhexidine (PERIDEX) 0.12 % solution 15 mL, 15 mL, Mouth/Throat, BID, Enid Baasadhika Kalisetti, MD, 15 mL at 08/27/14 2149 .  clonazePAM (KLONOPIN) tablet 0.5 mg, 0.5 mg, Oral, BID, Erin FullingKurian Sanjith Siwek, MD, 0.5 mg at 08/27/14 2146 .  dexmedetomidine (PRECEDEX) 400 MCG/100ML (4 mcg/mL) infusion, 0.4-1.2 mcg/kg/hr, Intravenous, Titrated, [place] SirenVivek J Sainani, MD, Last Rate: 26.3 mL/hr at 08/08/2014 0551, 1 mcg/kg/hr at 08/05/2014 0551 .  docusate (COLACE) 50 MG/5ML liquid 100 mg, 100 mg, Oral, BID, Erin FullingKurian Kashis Penley, MD, 100 mg at 08/27/14 2147 .  enoxaparin (LOVENOX) injection 40 mg, 40 mg, Subcutaneous, Q24H, Erin FullingKurian Mckynzie Liwanag, MD, 40 mg at 08/27/14 2147 .  famotidine (PEPCID) tablet 20 mg, 20 mg, Oral, BID, Auburn BilberryShreyang Patel, MD, 20 mg at 08/27/14 2146 .  feeding supplement (PRO-STAT SUGAR FREE 64) liquid 30 mL, 30 mL, Per Tube, 5 X Daily, Erin FullingKurian Suhas Estis, MD, 30 mL at 08/15/2014 0614 .  feeding supplement (VITAL HIGH PROTEIN) liquid 1,000 mL, 1,000 mL, Per Tube, Q24H, Erin FullingKurian Asif Muchow, MD, 1,000 mL at 08/27/14 1302 .  fentaNYL 2500mcg in NS 250mL (7110mcg/ml) infusion-PREMIX, 5 mcg/hr, Intravenous, Continuous, Erin FullingKurian [place] Giammona, MD, Last Rate: 10 mL/hr at 08/03/2014 0600, 100 mcg/hr at 08/20/2014 0600 .  fluconazole (DIFLUCAN) IVPB 100 mg,  100 mg, Intravenous, Q24H, Auburn Bilberry, MD .  free water 50 mL, 50 mL, Per Tube, Q1H, Erin Fulling, MD, 50 mL at 08/26/2014 0809 .  gabapentin (NEURONTIN) capsule 200 mg, 200 mg, Oral, BID, Natale Lay, MD, 200 mg at 08/27/14 2146 .  haloperidol lactate (HALDOL) injection 2 mg, 2 mg, Intravenous, Q4H PRN, Auburn Bilberry, MD, 2 mg at 08/23/14 2319 .  hydrALAZINE (APRESOLINE) injection 10 mg, 10 mg, Intravenous, Q6H PRN, Altamese Dilling, MD .  ipratropium-albuterol (DUONEB) 0.5-2.5 (3) MG/3ML nebulizer solution 3 mL, 3 mL, Nebulization, Q4H, Erin Fulling, MD, 3 mL at 08/27/2014  0744 .  LORazepam (ATIVAN) injection 1-2 mg, 1-2 mg, Intravenous, Q4H PRN, Altamese Dilling, MD, 2 mg at 08/06/2014 0840 .  midazolam (VERSED) injection 2 mg, 2 mg, Intravenous, Q2H PRN, Erin Fulling, MD, 2 mg at 08/25/14 2147 .  mirtazapine (REMERON) tablet 22.5 mg, 22.5 mg, Oral, QHS, Natale Lay, MD, 22.5 mg at 08/27/14 2146 .  morphine 2 MG/ML injection 2 mg, 2 mg, Intravenous, Q2H PRN, Lattie Haw, MD, 2 mg at 08/26/14 [phone] .  multivitamin liquid 5 mL, 5 mL, Oral, Daily, Erin Fulling, MD, 5 mL at 08/27/14 1036 .  norepinephrine (LEVOPHED) 4mg  in D5W premix infusion, 0-40 mcg/min, Intravenous, Titrated, Auburn Bilberry, MD, Last Rate: 7.5 mL/hr at 08/23/2014 0814, 2 mcg/min at 08/30/2014 0814 .  OLANZapine (ZYPREXA) tablet 2.5 mg, 2.5 mg, Oral, QHS, Natale Lay, MD, 2.5 mg at 08/27/14 2147 .  ondansetron (ZOFRAN) tablet 4 mg, 4 mg, Oral, Q6H PRN **OR** ondansetron (ZOFRAN) injection 4 mg, 4 mg, Intravenous, Q6H PRN, Lattie Haw, MD .  potassium chloride (KLOR-CON) packet 20 mEq, 20 mEq, Oral, BID, Erin Fulling, MD, 20 mEq at 08/27/14 2147 .  QUEtiapine (SEROQUEL) tablet 12.5 mg, 12.5 mg, Oral, QHS, Ida Rogue, MD, 12.5 mg at 08/27/14 2149 .  sennosides (SENOKOT) 8.8 MG/5ML syrup 10 mL, 10 mL, Oral, BID, Erin Fulling, MD, 10 mL at 08/27/14 2147 .  sertraline (ZOLOFT) tablet 50 mg, 50 mg, Oral, Daily, Ida Rogue, MD, 50 mg at 08/27/14 1027 .  traZODone (DESYREL) tablet 50 mg, 50 mg, Oral, QHS, Natale Lay, MD, 50 mg at 08/27/14 2146   FAMILY HISTORY   History reviewed. No pertinent family history.    SOCIAL HISTORY    reports that he has never smoked. He does not have any smokeless tobacco history on file. He reports that he does not drink alcohol or use illicit drugs.  Review of Systems  Unable to perform ROS: critical illness      VITAL SIGNS    Temp:  [98.2 F (36.8 C)-100.7 F (38.2 C)] 100 F (37.8 C) (05/27 0700) Pulse Rate:  [57-95] 79 (05/27  0800) Resp:  [9-24] 12 (05/27 0800) BP: (85-140)/(50-90) 102/57 mmHg (05/27 0800) SpO2:  [94 %-100 %] 96 % (05/27 0800) FiO2 (%):  [35 %] 35 % (05/27 0838) Weight:  [245 lb 6 oz (111.3 kg)] 245 lb 6 oz (111.3 kg) (05/27 0500) HEMODYNAMICS:   VENTILATOR SETTINGS: Vent Mode:  [-] PRVC FiO2 (%):  [35 %] 35 % Set Rate:  [6 bmp-16 bmp] 16 bmp Vt Set:  [550 mL] 550 mL PEEP:  [5 cmH20] 5 cmH20 Pressure Support:  [20 cmH20] 20 cmH20 INTAKE / OUTPUT:  Intake/Output Summary (Last 24 hours) at 08/11/2014 0920 Last data filed at 08/05/2014 0700  Gross per 24 hour  Intake 3422.25 ml  Output   1180 ml  Net 2242.25 ml  PHYSICAL EXAM   Physical Exam  Constitutional: He appears well-developed. He appears distressed.  HENT:  Head: Normocephalic and atraumatic.  Eyes: Pupils are equal, round, and reactive to light. No scleral icterus.  Neck: Normal range of motion. Neck supple.  Cardiovascular: Normal rate and regular rhythm.   No murmur heard. Pulmonary/Chest: He is in respiratory distress. He has no wheezes. He has rales.  resp distress  Abdominal: Soft. He exhibits distension. There is no tenderness.  t-tube in place  Musculoskeletal: He exhibits no edema.  Neurological: He displays normal reflexes. Coordination normal.  gcs<8T  Skin: Skin is warm. No rash noted. He is not diaphoretic.       LABS   LABS:  CBC  Recent Labs Lab 08/24/14 0717 08/25/14 0555 08/26/14 0350  WBC 13.4* 7.9 6.1  HGB 14.1 11.6* 11.7*  HCT 43.7 35.9* 35.8*  PLT 286 242 249   Coag's No results for input(s): APTT, INR in the last 168 hours. BMET  Recent Labs Lab 08/25/14 0555 08/26/14 0350 08/26/14 2039 08/27/14 0511  NA 146* 148*  --  146*  K 3.3* 3.1* 4.1 3.8  CL 114* 118*  --  116*  CO2 22 27  --  26  BUN 18 20  --  31*  CREATININE 0.78 0.71  --  0.98  GLUCOSE 129* 136*  --  166*   Electrolytes  Recent Labs Lab 08/25/14 0555 08/26/14 0350 08/27/14 0511  CALCIUM 7.4*  7.9* 8.1*  MG 2.3 2.3 2.2  PHOS 3.0 2.5 3.1   Sepsis Markers  Recent Labs Lab 08/26/14 1400 08/26/14 2253  LATICACIDVEN 1.1 1.4   ABG  Recent Labs Lab 08/24/14 0830 08/25/14 1105 08/26/14 1310  PHART 7.39 7.38 7.34*  PCO2ART 40 39 46  PO2ART 69* 64* 59*   Liver Enzymes  Recent Labs Lab 08/25/14 0555  AST 21  ALT 38  ALKPHOS 63  BILITOT 1.5*  ALBUMIN 2.2*   Cardiac Enzymes No results for input(s): TROPONINI, PROBNP in the last 168 hours. Glucose  Recent Labs Lab 08/27/14 1154 08/27/14 1811 08/27/14 2026 08/27/14 2353 09-10-2014 0359 2014/09/10 0802  GLUCAP 126* 141* 137* 132* 130* 126*     Recent Results (from the past 240 hour(s))  Culture, blood (routine x 2)     Status: None   Collection Time: 08/06/2014  6:50 PM  Result Value Ref Range Status   Specimen Description BLOOD  Final   Special Requests NONE  Final   Culture NO GROWTH 6 DAYS  Final   Report Status 08/25/2014 FINAL  Final  Culture, blood (routine x 2)     Status: None   Collection Time: 08/05/2014  7:00 PM  Result Value Ref Range Status   Specimen Description BLOOD  Final   Special Requests NONE  Final   Culture NO GROWTH 6 DAYS  Final   Report Status 08/25/2014 FINAL  Final  Body fluid culture     Status: None   Collection Time: 08/20/14 11:54 AM  Result Value Ref Range Status   Specimen Description BILE  Final   Special Requests NONE  Final   Gram Stain FEW WBC SEEN NO ORGANISMS SEEN   Final   Culture NO GROWTH 3 DAYS  Final   Report Status 08/24/2014 FINAL  Final  Anaerobic culture     Status: None   Collection Time: 08/20/14 11:54 AM  Result Value Ref Range Status   Specimen Description GALL  Final   Special Requests Normal  Final   Culture NO ANAEROBES ISOLATED  Final   Report Status 08/24/2014 FINAL  Final  Culture, bal-quantitative     Status: None (Preliminary result)   Collection Time: 08/24/14  2:45 PM  Result Value Ref Range Status   Specimen Description BRONCHIAL  ALVEOLAR LAVAGE  Final   Special Requests Normal  Final   Gram Stain   Final    MODERATE WBC SEEN FEW YEAST RARE GRAM NEGATIVE RODS GOOD SPECIMEN - 80-90% WBCS    Culture RARE GROWTH YEAST IDENTIFICATION TO FOLLOW   Final   Report Status PENDING  Incomplete  MRSA PCR Screening     Status: None   Collection Time: 08/24/14  4:01 PM  Result Value Ref Range Status   MRSA by PCR NEGATIVE NEGATIVE Final    Comment:        The GeneXpert MRSA Assay (FDA approved for NASAL specimens only), is one component of a comprehensive MRSA colonization surveillance program. It is not intended to diagnose MRSA infection nor to guide or monitor treatment for MRSA infections.       IMAGING    No results found.    Indwelling Urinary Catheter continued, requirement due to   Reason to continue Indwelling Urinary Catheter for strict Intake/Output monitoring for hemodynamic instability     Central line-continued for IV access  RASS goal -1 to -2           ASSESSMENT/PLAN    69 yo white male with post op hypoxic  resp failure likely  from acute hypoventilation with severe sepsis from acute cholecystitis and pneumonia  PULMONARY-resp failure-failed multiple  SAT/SBt due to resp muscle fatigue- Daily SAT/SBT as tolerated -continue Bronchodilator Therapy -Wean Fio2 and PEEP as tolerated  Aspiration pneumonia -follow up BAL cultures -continue abx coverage  CARDIOVASCULAR -Needs ICU montioring -vasopressors as needed for septic shock  RENAL -watch UO -continue foley catheter  GASTROINTESTINAL S/p ERCP S/p  percutaneous GB drain   HEMATOLOGIC -watch h/h  INFECTIOUS -on iv unasyn for cholecystitis/aspiration pneumonia  ENDOCRINE -watch FSBS  NEUROLOGIC -wean off sedation  Patient now DNR. Plan to continue daily SAT/SBT attempts will assess resp status daily  I have personally obtained a history, examined the patient, evaluated laboratory and imaging results,  formulated the assessment and plan and placed orders.  The Patient requires high complexity decision making for assessment and support, frequent evaluation and titration of therapies, application of advanced monitoring technologies and extensive interpretation of multiple databases. Critical Care Time devoted to patient care services described in this note is 40 minutes.   Overall, patient is critically ill, prognosis is guarded. Patient at high risk  cardiac arrest and death.   Lucie Leather, M.D. Pulmonary & Critical Care Medicine [facility] Medical Director Intensive Care Unit   08/14/2014, 9:20 AM

## 2014-09-02 NOTE — Progress Notes (Signed)
[facility] Updated referral number of [id number]  Coordinator:Tim VernonShore

## 2014-09-02 NOTE — Progress Notes (Signed)
   08/18/14 1623  Clinical Encounter Type  Visited With Patient and family together  Visit Type Initial;Spiritual support  Referral From Palliative care team  Consult/Referral To Chaplain  Spiritual Encounters  Spiritual Needs Grief support  Stress Factors  Patient Stress Factors Health changes  Family Stress Factors Health changes;Loss  Chaplain visited with family after patient was extubated. Provided a compassionate presence and support as applicable. Chaplain Dajuana Palen A. Solange Emry Ext. [phone]

## 2014-09-02 DEATH — deceased

## 2014-10-02 NOTE — Discharge Summary (Addendum)
  Date of Admission: 08/06/2014 11:54 AM  Date of death:  2014/12/08   Admitting diagnosis:  1. Right Flank pain     Diagnosis at time of death* 1. Acute respiratory failure 2.  Bilateral PNA 3.  Acute Choleycystitis with choledochoelithiasis 4. Sepsis due to Pneumonia and choledocholelithiasis       Hospital course : Patient was 69 y.o wm with hx of polio, who had limited mobility admitted by Dr. Excell Seltzerooper on 08/23/2014 with c/o flank pain. He was thought to have renal stone but was noted to have gallbladder disease on ct. Pt was started on iv antibiotics and further evaluation with GI was done. Pt was intubated for ERCP on 09/01/2014. Patient post procedure was extubated and then had to reintubated for respiratory difficulties.  Internal  Medicine consult was done on 08/22/2014. Patient was also seen by Dr. Belia HemanKasa of pulmonary critical care. He was maintained on ventalator until 08/21/14. After extubation patient continued to have breathing trouble. He was kept on high flow oxygen and bipap.  On 08/24/14 patient was reintubated.  Weaning trials were done but he continued to do poorly.  He did have ct scan of chest to r/o pulmonary embolism.  Which showed pna and b/l atelectasis.  Pt was seen by pallative care. He was made DNR first then further dissussion were held with family and he was made comfort care and extubated on January 17, 2015. Patient shortly passed away.           TOTAL TIME TAKING CARE OF THIS PATIENT: 25 minutes.    Auburn BilberryPATEL, Jalil Lorusso M.D on 09/02/2014 at 10:07 AM  Between 7am to 6pm - Pager - [phone]  After 6pm go to [email or web address] - password [email or web address] [facility]  [facility]  Office  [phone]  CC: Primary care physician; Corky DownsMASOUD,JAVED, MD

## 2016-07-19 IMAGING — CR DG CHEST 1V PORT
1 series · 1 of 1 positions shown · non-contrast
Comparison: August 22, 2013.

CLINICAL DATA: Status post intubation.

EXAM:
PORTABLE CHEST - 1 VIEW

[ap]
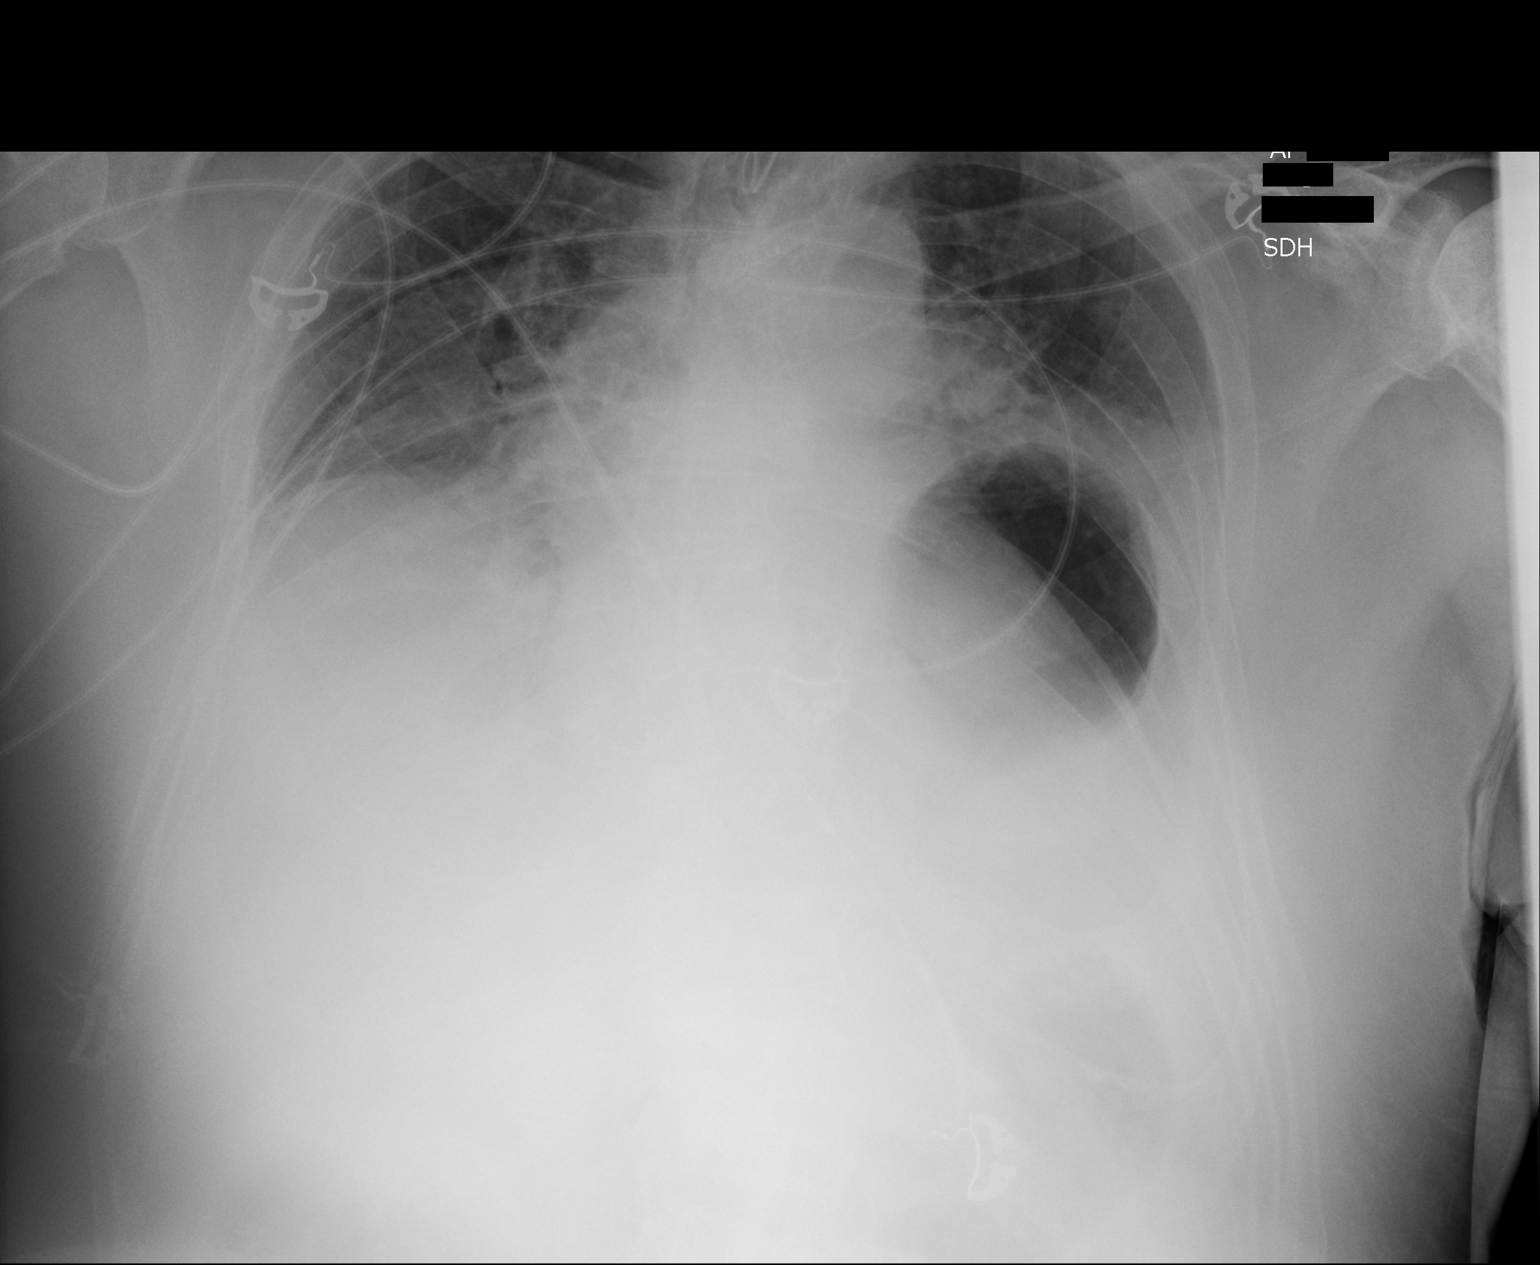

[1 of 1 positions shown; findings below may reference images not displayed]

FINDINGS: Stable cardiomediastinal silhouette. Severe hypoinflation of the
lungs is noted with elevated left hemidiaphragm. Left basilar
opacity is noted concerning for subsegmental atelectasis. No
pneumothorax is noted. Minimal right basilar opacity is noted most
consistent with subsegmental atelectasis. Endotracheal tube is in
grossly good position with distal tip 4 cm above the carina.
IMPRESSION: Severe hypoinflation of the lungs is noted with elevated left
hemidiaphragm. Left basilar subsegmental atelectasis is noted.
Endotracheal tube is in grossly good position.

## 2016-07-22 IMAGING — CT CT ANGIO CHEST
1 of 2 series · 18 of 30 positions shown · IV contrast (omnipaque)
Comparison: CTA chest 06/09/2013.

CLINICAL DATA: Patient extubated yesterday, acute onset of severe
shortness of breath earlier today.

EXAM:
CT ANGIOGRAPHY CHEST WITH CONTRAST
TECHNIQUE: Multidetector CT imaging of the chest was performed using the
standard protocol during bolus administration of intravenous
contrast. Multiplanar CT image reconstructions and MIPs were
obtained to evaluate the vascular anatomy.
CONTRAST:  100mL OMNIPAQUE IOHEXOL 350 MG/ML IV.

[Series 7: pe 1.0 thins · axial · 0.66mm/px · z∈[-740,-512]mm · 18 of 259 slices shown]
[im 15/259  lung]
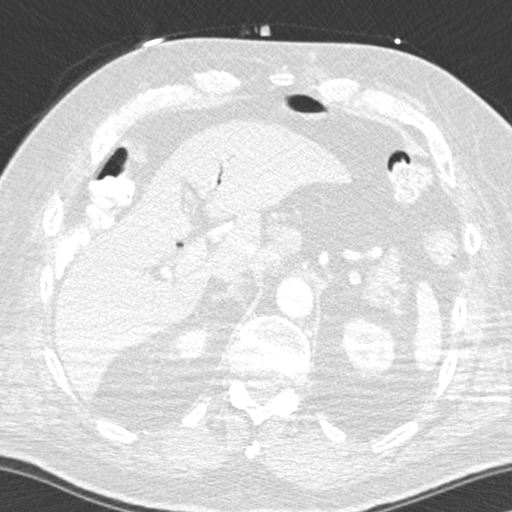
[im 29/259  mediastinal]
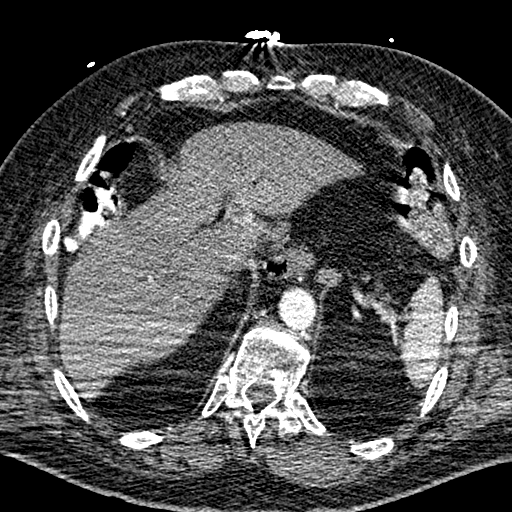
[im 44/259  lung]
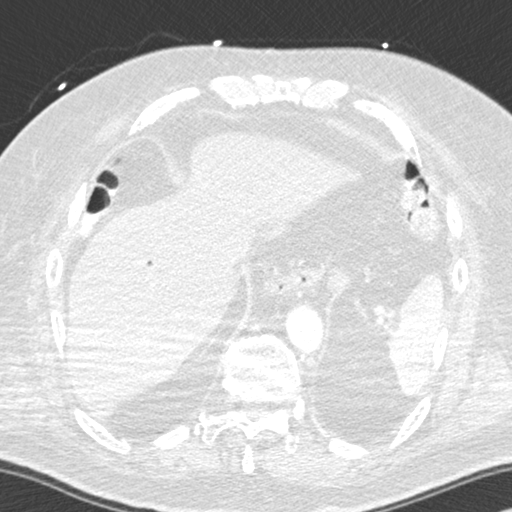
[im 58/259  mediastinal]
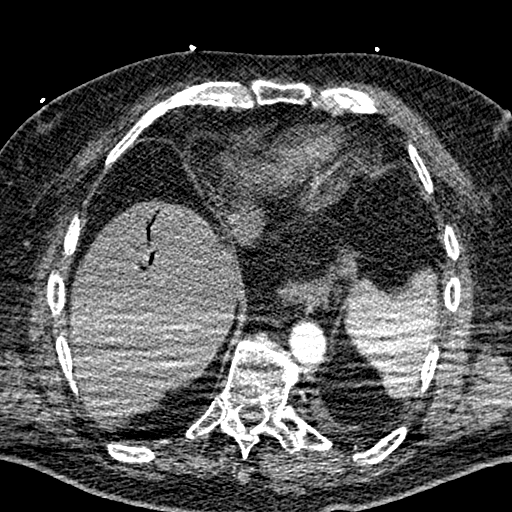
[im 72/259  lung]
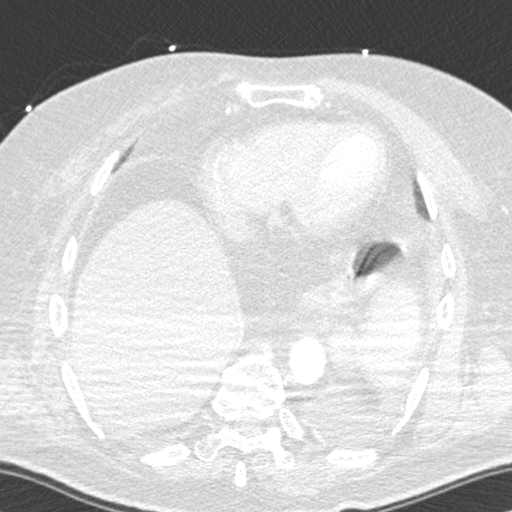
[im 87/259  mediastinal]
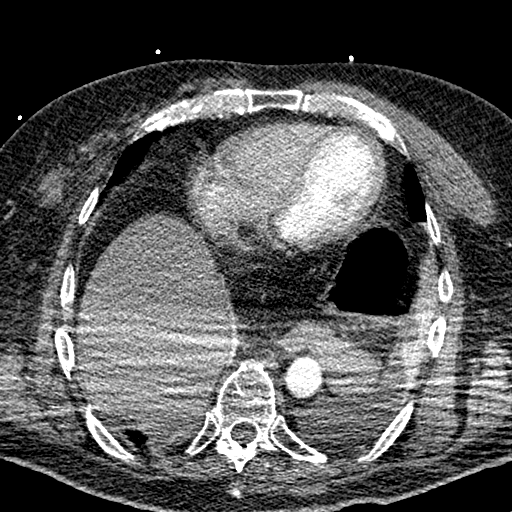
[im 101/259  lung]
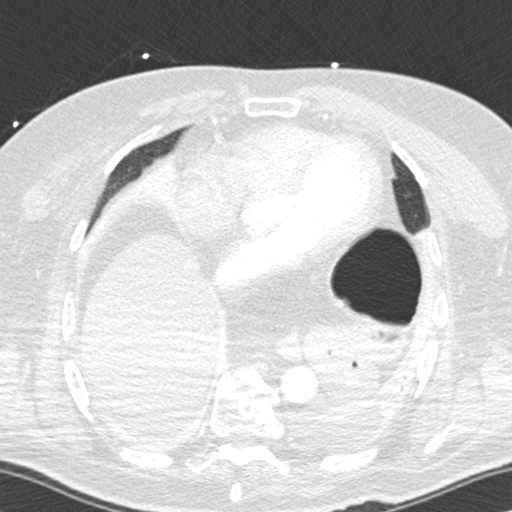
[im 115/259  mediastinal]
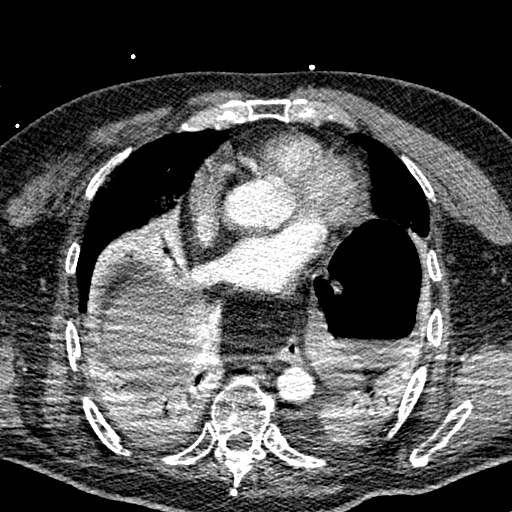
[im 120/259  lung]
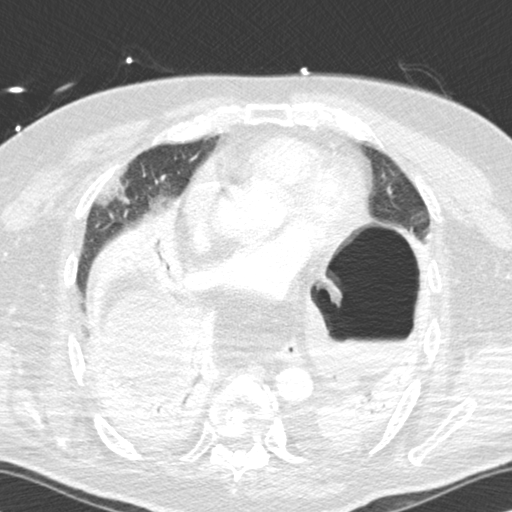
[im 130/259  mediastinal]
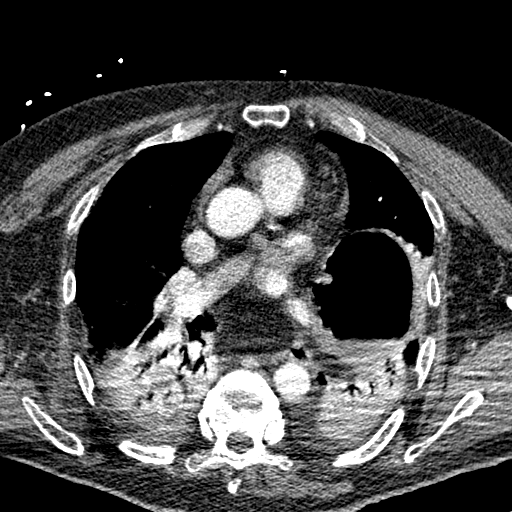
[im 144/259  lung]
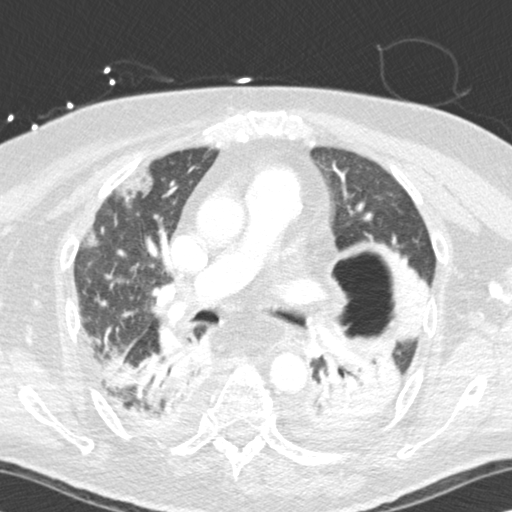
[im 158/259  mediastinal]
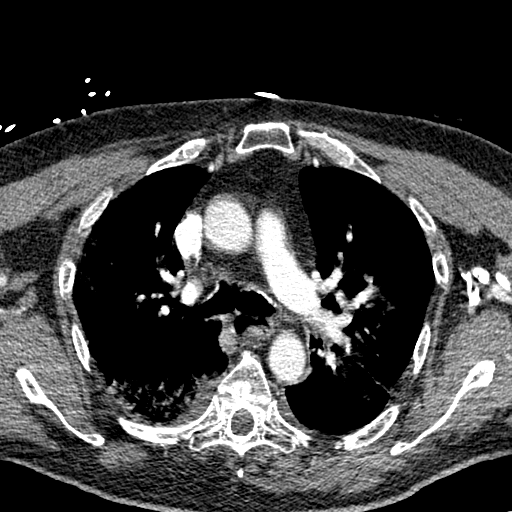
[im 173/259  lung]
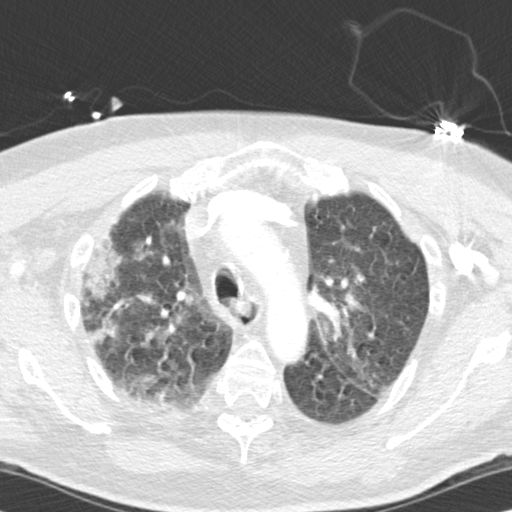
[im 187/259  mediastinal]
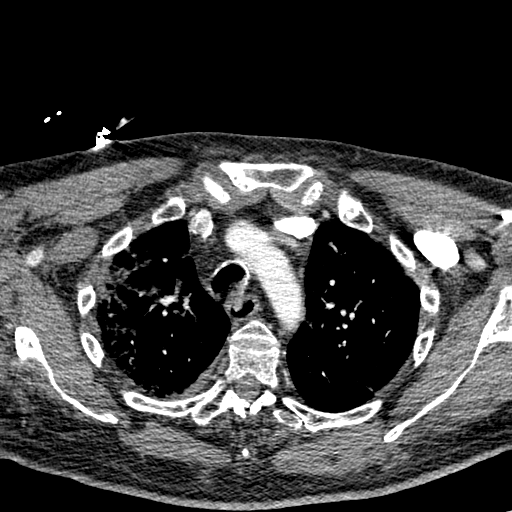
[im 201/259  lung]
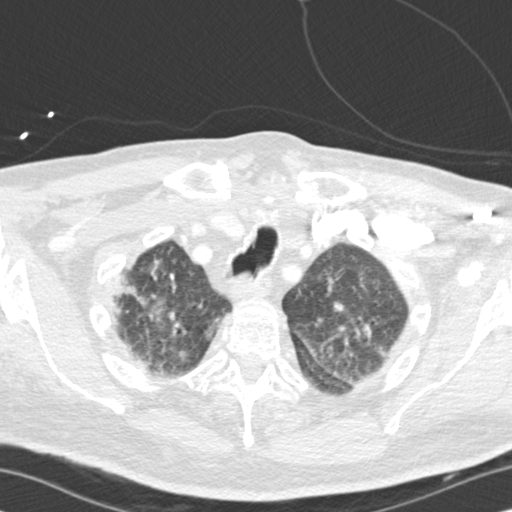
[im 216/259  mediastinal]
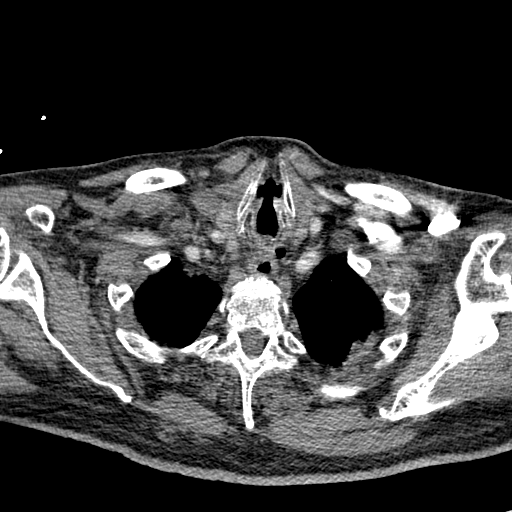
[im 230/259  lung]
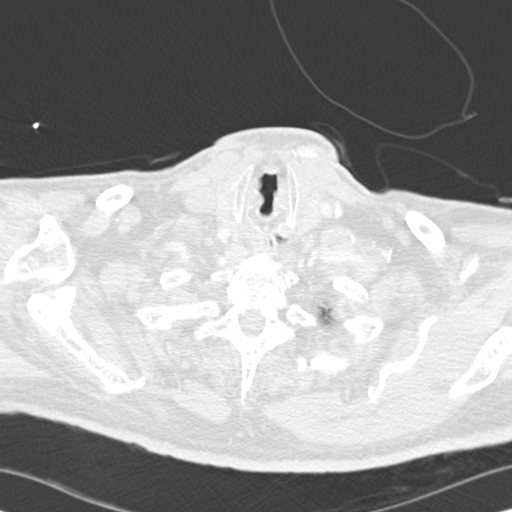
[im 244/259  mediastinal]
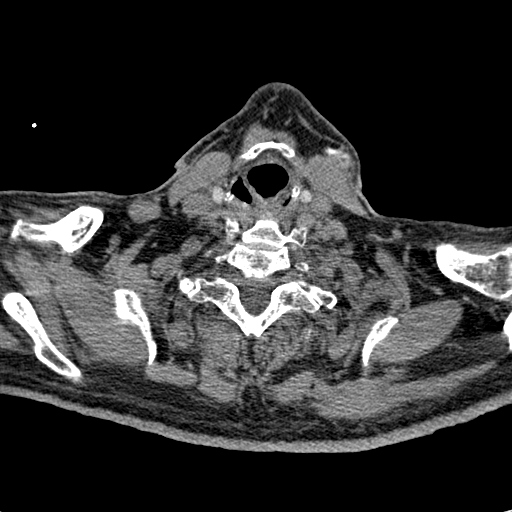

[18 of 30 positions shown; findings below may reference images not displayed]

FINDINGS: Beam hardening streak artifact as the patient was unable or
unwilling to raise the arms. Respiratory motion blurs many of the
images. Contrast opacification of the pulmonary arteries is good.
Overall, the study is of fair diagnostic quality.

No filling defects within either main pulmonary artery or their
central branches in either lung to suggest pulmonary embolism. Heart
moderately enlarged with 3 vessel coronary atherosclerosis. No
pericardial effusion. Mild atherosclerosis involving the aortic arch
and the proximal great vessels.

Markedly suboptimal inspiration with very low lung volumes. Dense
airspace consolidation with air bronchograms in the lower lobes.
Airspace opacities in the right upper lobe and right middle lobe.
Minimal airspace opacity in the left upper lobe. Small bilateral
pleural effusions. Central airways patent.

No significant mediastinal, hilar, axillary or supraclavicular
lymphadenopathy.

Gas within the nondistended independent bile ducts in the visualized
liver. Large hiatal hernia as noted previously, with nearly all of
the stomach present in the thorax. Visualized upper abdomen
otherwise unremarkable.

Review of the MIP images confirms the above findings.
IMPRESSION: 1. Technically suboptimal examination for the reasons described
above. No evidence of central pulmonary embolism.
2. Multilobar pneumonia involving both lungs, most confluent in the
lower lobes.
3. Small bilateral pleural effusions.
4. Large hiatal hernia.
5. Gas within nondistended bile ducts in the liver, presumably
related to prior biliary intervention.
